# Patient Record
Sex: Male | Born: 1945 | Race: White | Hispanic: No | Marital: Married | Smoking: Never smoker
Health system: Southern US, Community
[De-identification: ages and names within clinical notes are randomized; demographics above are authoritative.]

## PROBLEM LIST (undated history)

## (undated) DIAGNOSIS — I2692 Saddle embolus of pulmonary artery without acute cor pulmonale: Secondary | ICD-10-CM

## (undated) DIAGNOSIS — E785 Hyperlipidemia, unspecified: Secondary | ICD-10-CM

## (undated) DIAGNOSIS — I10 Essential (primary) hypertension: Secondary | ICD-10-CM

## (undated) DIAGNOSIS — M8580 Other specified disorders of bone density and structure, unspecified site: Secondary | ICD-10-CM

## (undated) DIAGNOSIS — C61 Malignant neoplasm of prostate: Secondary | ICD-10-CM

## (undated) DIAGNOSIS — R972 Elevated prostate specific antigen [PSA]: Secondary | ICD-10-CM

## (undated) DIAGNOSIS — I2699 Other pulmonary embolism without acute cor pulmonale: Secondary | ICD-10-CM

## (undated) DIAGNOSIS — D126 Benign neoplasm of colon, unspecified: Secondary | ICD-10-CM

## (undated) HISTORY — DX: Hyperlipidemia, unspecified: E78.5

## (undated) HISTORY — DX: Essential (primary) hypertension: I10

## (undated) HISTORY — DX: Saddle embolus of pulmonary artery without acute cor pulmonale: I26.92

## (undated) HISTORY — DX: Benign neoplasm of colon, unspecified: D12.6

## (undated) HISTORY — DX: Malignant neoplasm of prostate: C61

## (undated) HISTORY — DX: Other specified disorders of bone density and structure, unspecified site: M85.80

---
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
## 1976-07-05 HISTORY — PX: VASECTOMY: SHX75

## 2013-01-12 DIAGNOSIS — C61 Malignant neoplasm of prostate: Secondary | ICD-10-CM | POA: Diagnosis not present

## 2013-04-14 DIAGNOSIS — Z23 Encounter for immunization: Secondary | ICD-10-CM | POA: Diagnosis not present

## 2013-06-19 DIAGNOSIS — H40019 Open angle with borderline findings, low risk, unspecified eye: Secondary | ICD-10-CM | POA: Diagnosis not present

## 2013-06-20 ENCOUNTER — Ambulatory Visit (AMBULATORY_SURGERY_CENTER): Payer: Self-pay | Admitting: *Deleted

## 2013-06-20 VITALS — Ht 70.0 in | Wt 191.2 lb

## 2013-06-20 DIAGNOSIS — Z1211 Encounter for screening for malignant neoplasm of colon: Secondary | ICD-10-CM

## 2013-06-20 MED ORDER — MOVIPREP 100 G PO SOLR: ORAL | Status: DC

## 2013-06-20 NOTE — Progress Notes (Signed)
No allergies to eggs or soy. No problems with anesthesia.  

## 2013-07-09 ENCOUNTER — Ambulatory Visit (AMBULATORY_SURGERY_CENTER): Payer: Medicare Other | Admitting: Internal Medicine

## 2013-07-09 ENCOUNTER — Encounter: Payer: Self-pay | Admitting: Internal Medicine

## 2013-07-09 VITALS — BP 99/59 | HR 50 | Temp 96.4°F | Resp 17 | Ht 70.0 in | Wt 191.0 lb

## 2013-07-09 DIAGNOSIS — Z1211 Encounter for screening for malignant neoplasm of colon: Secondary | ICD-10-CM

## 2013-07-09 DIAGNOSIS — I10 Essential (primary) hypertension: Secondary | ICD-10-CM | POA: Diagnosis not present

## 2013-07-09 DIAGNOSIS — D126 Benign neoplasm of colon, unspecified: Secondary | ICD-10-CM

## 2013-07-09 MED ORDER — SODIUM CHLORIDE 0.9 % IV SOLN: 500.0000 mL | INTRAVENOUS | Status: DC

## 2013-07-09 NOTE — Progress Notes (Signed)
Report to pacu, rn, vss, bbs=clear at [phone]

## 2013-07-09 NOTE — Patient Instructions (Signed)
YOU HAD AN ENDOSCOPIC PROCEDURE TODAY AT THE [facility]: Refer to the procedure report that was given to you for any specific questions about what was found during the examination.  If the procedure report does not answer your questions, please call your gastroenterologist to clarify.  If you requested that your care partner not be given the details of your procedure findings, then the procedure report has been included in a sealed envelope for you to review at your convenience later.  YOU SHOULD EXPECT: Some feelings of bloating in the abdomen. Passage of more gas than usual.  Walking can help get rid of the air that was put into your GI tract during the procedure and reduce the bloating. If you had a lower endoscopy (such as a colonoscopy or flexible sigmoidoscopy) you may notice spotting of blood in your stool or on the toilet paper. If you underwent a bowel prep for your procedure, then you may not have a normal bowel movement for a few days.  DIET: Your first meal following the procedure should be a light meal and then it is ok to progress to your normal diet.  A half-sandwich or bowl of soup is an example of a good first meal.  Heavy or fried foods are harder to digest and may make you feel nauseous or bloated.  Likewise meals heavy in dairy and vegetables can cause extra gas to form and this can also increase the bloating.  Drink plenty of fluids but you should avoid alcoholic beverages for 24 hours.  ACTIVITY: Your care partner should take you home directly after the procedure.  You should plan to take it easy, moving slowly for the rest of the day.  You can resume normal activity the day after the procedure however you should NOT DRIVE or use heavy machinery for 24 hours (because of the sedation medicines used during the test).    SYMPTOMS TO REPORT IMMEDIATELY: A gastroenterologist can be reached at any hour.  During normal business hours, 8:30 AM to 5:00 PM Monday through Friday,  call [phone].  After hours and on weekends, please call the GI answering service at [phone] who will take a message and have the physician on call contact you.   Following lower endoscopy (colonoscopy or flexible sigmoidoscopy):  Excessive amounts of blood in the stool  Significant tenderness or worsening of abdominal pains  Swelling of the abdomen that is new, acute  Fever of 100F or higher    FOLLOW UP: If any biopsies were taken you will be contacted by phone or by letter within the next 1-3 weeks.  Call your gastroenterologist if you have not heard about the biopsies in 3 weeks.  Our staff will call the home number listed on your records the next business day following your procedure to check on you and address any questions or concerns that you may have at that time regarding the information given to you following your procedure. This is a courtesy call and so if there is no answer at the home number and we have not heard from you through the emergency physician on call, we will assume that you have returned to your regular daily activities without incident.  SIGNATURES/CONFIDENTIALITY: You and/or your care partner have signed paperwork which will be entered into your electronic medical record.  These signatures attest to the fact that that the information above on your After Visit Summary has been reviewed and is understood.  Full responsibility of the confidentiality   of this discharge information lies with you and/or your care-partner.     

## 2013-07-09 NOTE — Progress Notes (Signed)
Called to room to assist during endoscopic procedure.  Patient ID and intended procedure confirmed with present staff. Received instructions for my participation in the procedure from the performing physician.  

## 2013-07-10 ENCOUNTER — Telehealth: Payer: Self-pay

## 2013-07-10 NOTE — Telephone Encounter (Signed)
  Follow up Call-  Call back number 07/09/2013  Post procedure Call Back phone  # [phone]  Permission to leave phone message Yes     Patient questions:  Do you have a fever, pain , or abdominal swelling? no Pain Score  0 *  Have you tolerated food without any problems? yes  Have you been able to return to your normal activities? yes  Do you have any questions about your discharge instructions: Diet   no Medications  no Follow up visit  no  Do you have questions or concerns about your Care? no  Actions: * If pain score is 4 or above: No action needed, pain <4.

## 2013-07-13 ENCOUNTER — Encounter: Payer: Self-pay | Admitting: Internal Medicine

## 2013-07-19 DIAGNOSIS — Z125 Encounter for screening for malignant neoplasm of prostate: Secondary | ICD-10-CM | POA: Diagnosis not present

## 2013-07-19 DIAGNOSIS — E785 Hyperlipidemia, unspecified: Secondary | ICD-10-CM | POA: Diagnosis not present

## 2013-07-19 DIAGNOSIS — R7301 Impaired fasting glucose: Secondary | ICD-10-CM | POA: Diagnosis not present

## 2013-07-19 DIAGNOSIS — I10 Essential (primary) hypertension: Secondary | ICD-10-CM | POA: Diagnosis not present

## 2013-07-25 DIAGNOSIS — I10 Essential (primary) hypertension: Secondary | ICD-10-CM | POA: Diagnosis not present

## 2013-07-25 DIAGNOSIS — E785 Hyperlipidemia, unspecified: Secondary | ICD-10-CM | POA: Diagnosis not present

## 2013-07-25 DIAGNOSIS — Z23 Encounter for immunization: Secondary | ICD-10-CM | POA: Diagnosis not present

## 2013-07-25 DIAGNOSIS — R7301 Impaired fasting glucose: Secondary | ICD-10-CM | POA: Diagnosis not present

## 2013-07-25 DIAGNOSIS — Z0000 Encounter for general adult medical examination without abnormal findings: Secondary | ICD-10-CM | POA: Diagnosis not present

## 2013-07-25 DIAGNOSIS — G4700 Insomnia, unspecified: Secondary | ICD-10-CM | POA: Diagnosis not present

## 2013-07-25 DIAGNOSIS — C61 Malignant neoplasm of prostate: Secondary | ICD-10-CM | POA: Diagnosis not present

## 2013-07-25 DIAGNOSIS — Z86010 Personal history of colonic polyps: Secondary | ICD-10-CM | POA: Diagnosis not present

## 2013-07-25 DIAGNOSIS — Z1331 Encounter for screening for depression: Secondary | ICD-10-CM | POA: Diagnosis not present

## 2013-07-25 DIAGNOSIS — M722 Plantar fascial fibromatosis: Secondary | ICD-10-CM | POA: Diagnosis not present

## 2013-07-27 NOTE — Op Note (Signed)
[place]  [address] [place], [address]   COLONOSCOPY PROCEDURE REPORT  PATIENT: Anthony Miles, Anthony Miles  MR#: [id number] BIRTHDATE: 08/24/1945 , 49  yrs. old GENDER: Male ENDOSCOPIST: Eustace Quail, MD REFERRED BY:W.  Lutricia Feil, M.D. PROCEDURE DATE:  07/09/2013 PROCEDURE:   Colonoscopy with snare polypectomy x4 First Screening Colonoscopy - Avg.  risk and is 50 yrs.  old or older - No.  Prior Negative Screening - Now for repeat screening. 10 or more years since last screening  History of Adenoma - Now for follow-up colonoscopy & has been > or = to 3 yrs.  N/A  Polyps Removed Today? Yes. ASA CLASS:   Class II INDICATIONS:average risk screening.   patient reports normal exam in [place] 11 years ago MEDICATIONS: MAC sedation, administered by CRNA and propofol (Diprivan) 350mg  IV  DESCRIPTION OF PROCEDURE:   After the risks benefits and alternatives of the procedure were thoroughly explained, informed consent was obtained.  A digital rectal exam revealed no abnormalities of the rectum.   The LB [id number]  endoscope was introduced through the anus and advanced to the cecum, which was identified by both the appendix and ileocecal valve. No adverse events experienced.   The quality of the prep was excellent, using MoviPrep  The instrument was then slowly withdrawn as the colon was fully examined.  COLON FINDINGS: Four polyps were found in the ascending colon(7 mm, 8 mm), transverse colon(5 mm), and rectum(10 mm).  A polypectomy was performed with a cold snarefor the smaller 3 and hot snare for the rectal lesion.  The resection was complete and the polyp tissue was completely retrieved.   The colon mucosa was otherwise normal. Retroflexed views revealed no abnormalities. The time to cecum=1 minutes 39 seconds.  Withdrawal time=14 minutes 21 seconds.  The scope was withdrawn and the procedure completed. COMPLICATIONS: There were no  complications.  ENDOSCOPIC IMPRESSION: 1.   Four polyps were found in the  colon, and rectum; polypectomy was performed with a cold snare 2.   The colon mucosa was otherwise normal  RECOMMENDATIONS: 1. Repeat Colonoscopy in 3 years(multiple polyps).   eSigned:  Eustace Quail, MD 07/27/2013 4:56 PM   cc: Janalyn Rouse, MD and The Patient

## 2013-08-03 DIAGNOSIS — C61 Malignant neoplasm of prostate: Secondary | ICD-10-CM | POA: Diagnosis not present

## 2013-08-03 DIAGNOSIS — N401 Enlarged prostate with lower urinary tract symptoms: Secondary | ICD-10-CM | POA: Diagnosis not present

## 2013-08-03 DIAGNOSIS — N138 Other obstructive and reflux uropathy: Secondary | ICD-10-CM | POA: Diagnosis not present

## 2013-08-03 DIAGNOSIS — N139 Obstructive and reflux uropathy, unspecified: Secondary | ICD-10-CM | POA: Diagnosis not present

## 2013-11-14 DIAGNOSIS — D075 Carcinoma in situ of prostate: Secondary | ICD-10-CM | POA: Diagnosis not present

## 2013-11-14 DIAGNOSIS — C61 Malignant neoplasm of prostate: Secondary | ICD-10-CM | POA: Diagnosis not present

## 2013-11-14 DIAGNOSIS — IMO0002 Reserved for concepts with insufficient information to code with codable children: Secondary | ICD-10-CM | POA: Diagnosis not present

## 2013-11-14 DIAGNOSIS — N400 Enlarged prostate without lower urinary tract symptoms: Secondary | ICD-10-CM | POA: Diagnosis not present

## 2013-11-27 DIAGNOSIS — L570 Actinic keratosis: Secondary | ICD-10-CM | POA: Diagnosis not present

## 2013-11-27 DIAGNOSIS — Z85828 Personal history of other malignant neoplasm of skin: Secondary | ICD-10-CM | POA: Diagnosis not present

## 2013-11-27 DIAGNOSIS — L821 Other seborrheic keratosis: Secondary | ICD-10-CM | POA: Diagnosis not present

## 2013-11-27 DIAGNOSIS — D1801 Hemangioma of skin and subcutaneous tissue: Secondary | ICD-10-CM | POA: Diagnosis not present

## 2014-05-13 DIAGNOSIS — Z23 Encounter for immunization: Secondary | ICD-10-CM | POA: Diagnosis not present

## 2014-05-22 DIAGNOSIS — C61 Malignant neoplasm of prostate: Secondary | ICD-10-CM | POA: Diagnosis not present

## 2014-06-20 ENCOUNTER — Other Ambulatory Visit: Payer: Self-pay | Admitting: Dermatology

## 2014-06-20 DIAGNOSIS — C44719 Basal cell carcinoma of skin of left lower limb, including hip: Secondary | ICD-10-CM | POA: Diagnosis not present

## 2014-06-20 DIAGNOSIS — C44729 Squamous cell carcinoma of skin of left lower limb, including hip: Secondary | ICD-10-CM | POA: Diagnosis not present

## 2014-06-20 DIAGNOSIS — D485 Neoplasm of uncertain behavior of skin: Secondary | ICD-10-CM | POA: Diagnosis not present

## 2014-06-20 DIAGNOSIS — Z85828 Personal history of other malignant neoplasm of skin: Secondary | ICD-10-CM | POA: Diagnosis not present

## 2014-06-25 DIAGNOSIS — H40013 Open angle with borderline findings, low risk, bilateral: Secondary | ICD-10-CM | POA: Diagnosis not present

## 2014-07-05 DIAGNOSIS — C61 Malignant neoplasm of prostate: Secondary | ICD-10-CM

## 2014-07-05 HISTORY — DX: Malignant neoplasm of prostate: C61

## 2014-07-22 DIAGNOSIS — Z0000 Encounter for general adult medical examination without abnormal findings: Secondary | ICD-10-CM | POA: Diagnosis not present

## 2014-07-22 DIAGNOSIS — E785 Hyperlipidemia, unspecified: Secondary | ICD-10-CM | POA: Diagnosis not present

## 2014-07-22 DIAGNOSIS — I10 Essential (primary) hypertension: Secondary | ICD-10-CM | POA: Diagnosis not present

## 2014-07-22 DIAGNOSIS — R7301 Impaired fasting glucose: Secondary | ICD-10-CM | POA: Diagnosis not present

## 2014-07-22 DIAGNOSIS — Z125 Encounter for screening for malignant neoplasm of prostate: Secondary | ICD-10-CM | POA: Diagnosis not present

## 2014-08-07 DIAGNOSIS — Z1212 Encounter for screening for malignant neoplasm of rectum: Secondary | ICD-10-CM | POA: Diagnosis not present

## 2014-08-14 DIAGNOSIS — M722 Plantar fascial fibromatosis: Secondary | ICD-10-CM | POA: Diagnosis not present

## 2014-08-14 DIAGNOSIS — Z6828 Body mass index (BMI) 28.0-28.9, adult: Secondary | ICD-10-CM | POA: Diagnosis not present

## 2014-08-14 DIAGNOSIS — R7301 Impaired fasting glucose: Secondary | ICD-10-CM | POA: Diagnosis not present

## 2014-08-14 DIAGNOSIS — E785 Hyperlipidemia, unspecified: Secondary | ICD-10-CM | POA: Diagnosis not present

## 2014-08-14 DIAGNOSIS — I10 Essential (primary) hypertension: Secondary | ICD-10-CM | POA: Diagnosis not present

## 2014-08-14 DIAGNOSIS — Z86010 Personal history of colonic polyps: Secondary | ICD-10-CM | POA: Diagnosis not present

## 2014-08-14 DIAGNOSIS — Z23 Encounter for immunization: Secondary | ICD-10-CM | POA: Diagnosis not present

## 2014-08-14 DIAGNOSIS — C61 Malignant neoplasm of prostate: Secondary | ICD-10-CM | POA: Diagnosis not present

## 2014-08-14 DIAGNOSIS — Z1389 Encounter for screening for other disorder: Secondary | ICD-10-CM | POA: Diagnosis not present

## 2014-08-14 DIAGNOSIS — Z0000 Encounter for general adult medical examination without abnormal findings: Secondary | ICD-10-CM | POA: Diagnosis not present

## 2014-08-14 DIAGNOSIS — G4700 Insomnia, unspecified: Secondary | ICD-10-CM | POA: Diagnosis not present

## 2014-09-18 DIAGNOSIS — H52223 Regular astigmatism, bilateral: Secondary | ICD-10-CM | POA: Diagnosis not present

## 2014-09-18 DIAGNOSIS — H5213 Myopia, bilateral: Secondary | ICD-10-CM | POA: Diagnosis not present

## 2014-09-18 DIAGNOSIS — T1501XA Foreign body in cornea, right eye, initial encounter: Secondary | ICD-10-CM | POA: Diagnosis not present

## 2014-11-29 DIAGNOSIS — N401 Enlarged prostate with lower urinary tract symptoms: Secondary | ICD-10-CM | POA: Diagnosis not present

## 2014-11-29 DIAGNOSIS — R3916 Straining to void: Secondary | ICD-10-CM | POA: Diagnosis not present

## 2014-11-29 DIAGNOSIS — C61 Malignant neoplasm of prostate: Secondary | ICD-10-CM | POA: Diagnosis not present

## 2014-12-09 ENCOUNTER — Other Ambulatory Visit (HOSPITAL_COMMUNITY): Payer: Self-pay | Admitting: Urology

## 2014-12-09 DIAGNOSIS — R972 Elevated prostate specific antigen [PSA]: Secondary | ICD-10-CM

## 2014-12-09 DIAGNOSIS — C61 Malignant neoplasm of prostate: Secondary | ICD-10-CM

## 2014-12-25 ENCOUNTER — Ambulatory Visit (HOSPITAL_COMMUNITY)
Admission: RE | Admit: 2014-12-25 | Discharge: 2014-12-25 | Disposition: A | Discharge status: Home / Self Care | Payer: Medicare Other | Source: Ambulatory Visit | Attending: Urology | Admitting: Urology

## 2014-12-25 DIAGNOSIS — C61 Malignant neoplasm of prostate: Secondary | ICD-10-CM | POA: Diagnosis not present

## 2014-12-25 DIAGNOSIS — R972 Elevated prostate specific antigen [PSA]: Secondary | ICD-10-CM | POA: Insufficient documentation

## 2014-12-25 LAB — POCT I-STAT CREATININE: Creatinine, Ser: 1.3 mg/dL — ABNORMAL HIGH (ref 0.61–1.24)

## 2014-12-25 MED ORDER — GADOBENATE DIMEGLUMINE 529 MG/ML IV SOLN
20.0000 mL | Freq: Once | INTRAVENOUS | Status: AC | PRN
  Administered 2014-12-25: 17 mL via INTRAVENOUS

## 2014-12-26 DIAGNOSIS — C61 Malignant neoplasm of prostate: Secondary | ICD-10-CM | POA: Diagnosis not present

## 2015-01-02 ENCOUNTER — Telehealth: Payer: Self-pay | Admitting: Medical Oncology

## 2015-01-02 NOTE — Telephone Encounter (Signed)
Oncology Nurse Navigator Documentation  Oncology Nurse Navigator Flowsheets 01/02/2015  Referral date to RadOnc/MedOnc 01/01/2015  Navigator Encounter Type Introductory phone call- I called pt to introduce myself as the Prostate oncology navigator. I spoke with his wife and she states after leaving Dr. Lynne Logan office and reading literature about the robotic prostatectomy, he has decided to do surgery. He called Nira Conn, Dr.Borden's nurse late yesterday and informed her. They have an appointment in his office July 12. She would like to cancel the appointment July 8th. I will verify this with Dr. Alinda Money.   Time Spent with Patient 15

## 2015-01-10 ENCOUNTER — Ambulatory Visit: Payer: Medicare Other | Admitting: Radiation Oncology

## 2015-01-14 DIAGNOSIS — C61 Malignant neoplasm of prostate: Secondary | ICD-10-CM | POA: Diagnosis not present

## 2015-01-16 ENCOUNTER — Other Ambulatory Visit: Payer: Self-pay | Admitting: Urology

## 2015-02-04 DIAGNOSIS — J019 Acute sinusitis, unspecified: Secondary | ICD-10-CM | POA: Diagnosis not present

## 2015-03-12 DIAGNOSIS — C61 Malignant neoplasm of prostate: Secondary | ICD-10-CM | POA: Diagnosis not present

## 2015-03-12 DIAGNOSIS — M6281 Muscle weakness (generalized): Secondary | ICD-10-CM | POA: Diagnosis not present

## 2015-03-12 DIAGNOSIS — R278 Other lack of coordination: Secondary | ICD-10-CM | POA: Diagnosis not present

## 2015-03-18 DIAGNOSIS — N401 Enlarged prostate with lower urinary tract symptoms: Secondary | ICD-10-CM | POA: Diagnosis not present

## 2015-03-18 DIAGNOSIS — R3916 Straining to void: Secondary | ICD-10-CM | POA: Diagnosis not present

## 2015-03-18 DIAGNOSIS — R278 Other lack of coordination: Secondary | ICD-10-CM | POA: Diagnosis not present

## 2015-03-18 DIAGNOSIS — M6281 Muscle weakness (generalized): Secondary | ICD-10-CM | POA: Diagnosis not present

## 2015-04-03 NOTE — Patient Instructions (Addendum)
Anthony Miles  04/03/2015   Your procedure is scheduled on: Thursday 04/10/2015  Report to [facility]  Entrance take [place]  elevators to 3rd floor to  [place] at  [place] AM.  Call this number if you have problems the morning of surgery [phone]   Remember: ONLY 1 PERSON MAY GO WITH YOU TO SHORT STAY TO GET  READY MORNING OF [place].  Do not eat food or drink liquids :After Midnight.              FOLLOW BOWEL PREP INSTRUCTIONS FROM DR. BORDEN'S OFFICE AND FOLLOW A CLEAR LIQUID DIET THE DAY BEFORE!   CLEAR LIQUID DIET   Foods Allowed                                                                     Foods Excluded  Coffee and tea, regular and decaf                             liquids that you cannot  Plain Jell-O in any flavor                                             see through such as: Fruit ices (not with fruit pulp)                                     milk, soups, orange juice  Iced Popsicles                                    All solid food Carbonated beverages, regular and diet                                    Cranberry, grape and apple juices Sports drinks like Gatorade Lightly seasoned clear broth or consume(fat free) Sugar, honey syrup  Sample Menu Breakfast                                Lunch                                     Supper Cranberry juice                    Beef broth                            Chicken broth Jell-O  Grape juice                           Apple juice Coffee or tea                        Jell-O                                      Popsicle                                                Coffee or tea                        Coffee or tea  _____________________________________________________________________              Take these medicines the morning of surgery with A SIP OF WATER: none  DO NOT TAKE ANY DIABETIC MEDICATIONS DAY OF YOUR SURGERY                         You may not have any metal on your body including hair pins and              piercings  Do not wear jewelry, make-up, lotions, powders or perfumes, deodorant             Do not wear nail polish.  Do not shave  48 hours prior to surgery.              Men may shave face and neck.   Do not bring valuables to the hospital. [place].  Contacts, dentures or bridgework may not be worn into surgery.  Leave suitcase in the car. After surgery it may be brought to your room.     Patients discharged the day of surgery will not be allowed to drive home.  Name and phone number of your driver:  Special Instructions: N/A              Please read over the following fact sheets you were given: _____________________________________________________________________             [facility] - Preparing for Surgery Before surgery, you can play an important role.  Because skin is not sterile, your skin needs to be as free of germs as possible.  You can reduce the number of germs on your skin by washing with CHG (chlorahexidine gluconate) soap before surgery.  CHG is an antiseptic cleaner which kills germs and bonds with the skin to continue killing germs even after washing. Please DO NOT use if you have an allergy to CHG or antibacterial soaps.  If your skin becomes reddened/irritated stop using the CHG and inform your nurse when you arrive at Short Stay. Do not shave (including legs and underarms) for at least 48 hours prior to the first CHG shower.  You may shave your face/neck. Please follow these instructions carefully:  1.  Shower with CHG Soap the night before surgery and the  morning of Surgery.  2.  If you choose to wash your  hair, wash your hair first as usual with your  normal  shampoo.  3.  After you shampoo, rinse your hair and body thoroughly to remove the  shampoo.                           4.  Use CHG as you would any other  liquid soap.  You can apply chg directly  to the skin and wash                       Gently with a scrungie or clean washcloth.  5.  Apply the CHG Soap to your body ONLY FROM THE NECK DOWN.   Do not use on face/ open                           Wound or open sores. Avoid contact with eyes, ears mouth and genitals (private parts).                       Wash face,  Genitals (private parts) with your normal soap.             6.  Wash thoroughly, paying special attention to the area where your surgery  will be performed.  7.  Thoroughly rinse your body with warm water from the neck down.  8.  DO NOT shower/wash with your normal soap after using and rinsing off  the CHG Soap.                9.  Pat yourself dry with a clean towel.            10.  Wear clean pajamas.            11.  Place clean sheets on your bed the night of your first shower and do not  sleep with pets. Day of Surgery : Do not apply any lotions/deodorants the morning of surgery.  Please wear clean clothes to the hospital/surgery center.  FAILURE TO FOLLOW THESE INSTRUCTIONS MAY RESULT IN THE CANCELLATION OF YOUR SURGERY PATIENT SIGNATURE_________________________________  NURSE SIGNATURE__________________________________  ________________________________________________________________________   Anthony Miles  An incentive spirometer is a tool that can help keep your lungs clear and active. This tool measures how well you are filling your lungs with each breath. Taking long deep breaths may help reverse or decrease the chance of developing breathing (pulmonary) problems (especially infection) following:  A long period of time when you are unable to move or be active. BEFORE THE PROCEDURE   If the spirometer includes an indicator to show your best effort, your nurse or respiratory therapist will set it to a desired goal.  If possible, sit up straight or lean slightly forward. Try not to slouch.  Hold the incentive  spirometer in an upright position. INSTRUCTIONS FOR USE   Sit on the edge of your bed if possible, or sit up as far as you can in bed or on a chair.  Hold the incentive spirometer in an upright position.  Breathe out normally.  Place the mouthpiece in your mouth and seal your lips tightly around it.  Breathe in slowly and as deeply as possible, raising the piston or the ball toward the top of the column.  Hold your breath for 3-5 seconds or for as long as possible. Allow the piston or ball to fall to the bottom of  the column.  Remove the mouthpiece from your mouth and breathe out normally.  Rest for a few seconds and repeat Steps 1 through 7 at least 10 times every 1-2 hours when you are awake. Take your time and take a few normal breaths between deep breaths.  The spirometer may include an indicator to show your best effort. Use the indicator as a goal to work toward during each repetition.  After each set of 10 deep breaths, practice coughing to be sure your lungs are clear. If you have an incision (the cut made at the time of surgery), support your incision when coughing by placing a pillow or rolled up towels firmly against it. Once you are able to get out of bed, walk around indoors and cough well. You may stop using the incentive spirometer when instructed by your caregiver.  RISKS AND COMPLICATIONS  Take your time so you do not get dizzy or light-headed.  If you are in pain, you may need to take or ask for pain medication before doing incentive spirometry. It is harder to take a deep breath if you are having pain. AFTER USE  Rest and breathe slowly and easily.  It can be helpful to keep track of a log of your progress. Your caregiver can provide you with a simple table to help with this. If you are using the spirometer at home, follow these instructions: [place] IF:   You are having difficultly using the spirometer.  You have trouble using the spirometer as  often as instructed.  Your pain medication is not giving enough relief while using the spirometer.  You develop fever of 100.5 F (38.1 C) or higher. SEEK IMMEDIATE MEDICAL CARE IF:   You cough up bloody sputum that had not been present before.  You develop fever of 102 F (38.9 C) or greater.  You develop worsening pain at or near the incision site. MAKE SURE YOU:   Understand these instructions.  Will watch your condition.  Will get help right away if you are not doing well or get worse. Document Released: 11/01/2006 Document Revised: 09/13/2011 Document Reviewed: 01/02/2007 ExitCare Patient Information 2014 ExitCare, [place].   ________________________________________________________________________ WHAT IS A BLOOD TRANSFUSION? Blood Transfusion Information  A transfusion is the replacement of blood or some of its parts. Blood is made up of multiple cells which provide different functions.  Red blood cells carry oxygen and are used for blood loss replacement.  White blood cells fight against infection.  Platelets control bleeding.  Plasma helps clot blood.  Other blood products are available for specialized needs, such as hemophilia or other clotting disorders. BEFORE THE TRANSFUSION  Who gives blood for transfusions?   Healthy volunteers who are fully evaluated to make sure their blood is safe. This is blood bank blood. Transfusion therapy is the safest it has ever been in the practice of medicine. Before blood is taken from a donor, a complete history is taken to make sure that person has no history of diseases nor engages in risky social behavior (examples are intravenous drug use or sexual activity with multiple partners). The donor's travel history is screened to minimize risk of transmitting infections, such as malaria. The donated blood is tested for signs of infectious diseases, such as HIV and hepatitis. The blood is then tested to be sure it is compatible with  you in order to minimize the chance of a transfusion reaction. If you or a relative donates blood, this is often done  in anticipation of surgery and is not appropriate for emergency situations. It takes many days to process the donated blood. RISKS AND COMPLICATIONS Although transfusion therapy is very safe and saves many lives, the main dangers of transfusion include:   Getting an infectious disease.  Developing a transfusion reaction. This is an allergic reaction to something in the blood you were given. Every precaution is taken to prevent this. The decision to have a blood transfusion has been considered carefully by your caregiver before blood is given. Blood is not given unless the benefits outweigh the risks. AFTER THE TRANSFUSION  Right after receiving a blood transfusion, you will usually feel much better and more energetic. This is especially true if your red blood cells have gotten low (anemic). The transfusion raises the level of the red blood cells which carry oxygen, and this usually causes an energy increase.  The nurse administering the transfusion will monitor you carefully for complications. HOME CARE INSTRUCTIONS  No special instructions are needed after a transfusion. You may find your energy is better. Speak with your caregiver about any limitations on activity for underlying diseases you may have. SEEK MEDICAL CARE IF:   Your condition is not improving after your transfusion.  You develop redness or irritation at the intravenous (IV) site. SEEK IMMEDIATE MEDICAL CARE IF:  Any of the following symptoms occur over the next 12 hours:  Shaking chills.  You have a temperature by mouth above 102 F (38.9 C), not controlled by medicine.  Chest, back, or muscle pain.  People around you feel you are not acting correctly or are confused.  Shortness of breath or difficulty breathing.  Dizziness and fainting.  You get a rash or develop hives.  You have a decrease in  urine output.  Your urine turns a dark color or changes to pink, red, or brown. Any of the following symptoms occur over the next 10 days:  You have a temperature by mouth above 102 F (38.9 C), not controlled by medicine.  Shortness of breath.  Weakness after normal activity.  The white part of the eye turns yellow (jaundice).  You have a decrease in the amount of urine or are urinating less often.  Your urine turns a dark color or changes to pink, red, or brown. Document Released: 06/18/2000 Document Revised: 09/13/2011 Document Reviewed: 02/05/2008 [facility] Patient Information 2014 [place], [place].  _______________________________________________________________________

## 2015-04-07 ENCOUNTER — Encounter (HOSPITAL_COMMUNITY)
Admission: RE | Admit: 2015-04-07 | Discharge: 2015-04-07 | Disposition: A | Discharge status: Home / Self Care | Payer: Medicare Other | Source: Ambulatory Visit | Attending: Urology | Admitting: Urology

## 2015-04-07 ENCOUNTER — Encounter (HOSPITAL_COMMUNITY): Payer: Self-pay

## 2015-04-07 ENCOUNTER — Ambulatory Visit (HOSPITAL_COMMUNITY)
Admission: RE | Admit: 2015-04-07 | Discharge: 2015-04-07 | Disposition: A | Discharge status: Home / Self Care | Payer: Medicare Other | Source: Ambulatory Visit | Attending: Urology | Admitting: Urology

## 2015-04-07 DIAGNOSIS — I10 Essential (primary) hypertension: Secondary | ICD-10-CM | POA: Diagnosis not present

## 2015-04-07 DIAGNOSIS — R001 Bradycardia, unspecified: Secondary | ICD-10-CM

## 2015-04-07 DIAGNOSIS — Z01812 Encounter for preprocedural laboratory examination: Secondary | ICD-10-CM | POA: Insufficient documentation

## 2015-04-07 DIAGNOSIS — E7800 Pure hypercholesterolemia, unspecified: Secondary | ICD-10-CM | POA: Diagnosis not present

## 2015-04-07 DIAGNOSIS — C61 Malignant neoplasm of prostate: Secondary | ICD-10-CM | POA: Insufficient documentation

## 2015-04-07 DIAGNOSIS — Z0183 Encounter for blood typing: Secondary | ICD-10-CM | POA: Insufficient documentation

## 2015-04-07 DIAGNOSIS — Z01818 Encounter for other preprocedural examination: Secondary | ICD-10-CM

## 2015-04-07 DIAGNOSIS — Z7982 Long term (current) use of aspirin: Secondary | ICD-10-CM | POA: Diagnosis not present

## 2015-04-07 LAB — CBC
HCT: 47.1 % (ref 39.0–52.0)
HEMOGLOBIN: 15.7 g/dL (ref 13.0–17.0)
MCH: 31.7 pg (ref 26.0–34.0)
MCHC: 33.3 g/dL (ref 30.0–36.0)
MCV: 95 fL (ref 78.0–100.0)
Platelets: 247 10*3/uL (ref 150–400)
RBC: 4.96 MIL/uL (ref 4.22–5.81)
RDW: 13.2 % (ref 11.5–15.5)
WBC: 7.1 10*3/uL (ref 4.0–10.5)

## 2015-04-07 LAB — BASIC METABOLIC PANEL
ANION GAP: 8 (ref 5–15)
BUN: 22 mg/dL — ABNORMAL HIGH (ref 6–20)
CALCIUM: 9.2 mg/dL (ref 8.9–10.3)
CO2: 25 mmol/L (ref 22–32)
Chloride: 105 mmol/L (ref 101–111)
Creatinine, Ser: 1.27 mg/dL — ABNORMAL HIGH (ref 0.61–1.24)
GFR, EST NON AFRICAN AMERICAN: 57 mL/min — AB (ref >60)
GLUCOSE: 101 mg/dL — AB (ref 65–99)
POTASSIUM: 4.3 mmol/L (ref 3.5–5.1)
SODIUM: 138 mmol/L (ref 135–145)

## 2015-04-07 LAB — ABO/RH: ABO/RH(D): A POS

## 2015-04-07 NOTE — Progress Notes (Signed)
014/21/2015-EKG from [facility] on chart.

## 2015-04-09 NOTE — H&P (Signed)
History of Present Illness Mr. Anthony Miles is a 69 year old previously followed by Dr. Terance Hart with the following urologic history:    1) Prostate cancer: He was diagnosed with low risk clinically localized prostate cancer by Dr. Terance Hart in August 2010. After discussing management options, he elected to proceed with active surveillance.    Initial diagnosis: August 2010  TNM stage: cT2a (left induration) Nx Mx  PSA at diagnosis: 6.1  Gleason score: 3+3=6  Initial biopsy (02/06/09): 1/12 cores -- L mid (5%)  Prostate volume: 44 cc  PSAD: 0.14    Surveillance:  Nov 2011: 12 core biopsy -- No malignancy, L lateral base (atypia), L base (HGPIN), Vol 52 cc  Nov 2013: 12 core biopsy - 2/12 cores, L lateral mid (30%, 3+3=6), L mid (40%, 3+3=6), HGPIN at L base and L lateral base, Vol 61.5 cc  May 2015: 12 core biopsy- 2/12 cores - L lateral mid (30%, 3+3=6), L m id (50%, 3+3=6), Atyp at L lateral base and HGPIN at L apex, Vol 59.0 cc  May 2016 - increase of PSA to 11.26 Dec 2014: MRI of prostate - Left sided lesion with high suspicion for high grade, high volume disease, no LAD, SVI, or EPE  12/26/14: 16 core biopsy (10 from left side): 10/16 cores positive (all cores on left side positive), Vol 71.7 cc    Left: L lateral apex (2 cores, 50% and 80%, 3+4=7), L apex (2 cores, 40% and 30%, 3+4=7, PNI), L lateral mid (2 cores, 70% and 70%, 3+4=7), L mid (2 cores, 80% and 50%, 3+4=7), L lateral base (30%, 3+4=7), L base (30%, 3+4=7)     Past Medical History Problems  1. History of hypercholesterolemia (Z86.39) 2. History of hypertension (Z86.79) 3. Prostate cancer (C61)  Surgical History Problems  1. History of Surgery Of Male Genitalia Vasectomy 2. History of Tonsillectomy  Current Meds 1. Aspirin 81 MG TABS;  Therapy: (Recorded:23Jun2010) to Recorded 2. Crestor 20 MG Oral Tablet;  Therapy: [id number] to Recorded 3. Hydrochlorothiazide 25 MG Oral Tablet;  Therapy:  (HUTMLYYT:[id number]) to Recorded 4. Lisinopril 20 MG Oral Tablet;  Therapy: [id number] to Recorded 5. Lumigan 0.01 % Ophthalmic Solution;  Therapy: [id number] to Recorded 6. Zolpidem Tartrate 5 MG Oral Tablet;  Therapy: [id number] to Recorded  Allergies Medication  1. No Known Drug Allergies  Family History Problems  1. Family history of Breast Cancer : Mother 2. Family history of Death In The Family Father   age 35 of dementia 3. Family history of Death In The Family Mother   age 17 of breast cancer 37. Family history of Dementia : Father 41. Family history of Family Health Status Number Of Children   1 daughter  Social History Problems  1. Alcohol Use   2 a day 2. Marital History - Currently Married 3. Never A Smoker 4. Occupation:   retired Human resources officer  Physical Exam Constitutional: Well nourished and well developed . No acute distress.  ENT:. The ears and nose are normal in appearance.  Neck: The appearance of the neck is normal and no neck mass is present.  Pulmonary: No respiratory distress, normal respiratory rhythm and effort and clear bilateral breath sounds.  Cardiovascular: Heart rate and rhythm are normal . No peripheral edema.  Abdomen: The abdomen is soft and nontender. No masses are palpated. No CVA tenderness. No hernias are palpable. No hepatosplenomegaly noted.  Skin: Normal skin turgor, no visible rash and no visible skin lesions.  Neuro/Psych:. Mood and affect  are appropriate.    Results/Data Selected Results  PSA 27NTZ0017 08:35AM Raynelle Bring  SPECIMEN TYPE: BLOOD   Test Name Result Flag Reference  PSA 11.23 ng/mL H <=4.00  TEST METHODOLOGY: ECLIA PSA (ELECTROCHEMILUMINESCENCE IMMUNOASSAY)   Assessment Assessed  1. Prostate cancer (C61)    Discussion/Summary 1. Prostate cancer: He has decided to proceed with surgical therapy and will undergo a partial left nerve sparing and full right nerve sparing robot-assisted laparoscopic  radical prostatectomy and bilateral pelvic lymphadenectomy.

## 2015-04-09 NOTE — Anesthesia Preprocedure Evaluation (Addendum)
Anesthesia Evaluation  Patient identified by MRN, date of birth, ID band Patient awake    Reviewed: Allergy & Precautions, H&P , NPO status , Patient's Chart, lab work & pertinent test results  Airway Mallampati: II  TM Distance: >3 FB Neck ROM: full    Dental no notable dental hx. (+) Dental Advisory Given, Teeth Intact   Pulmonary neg pulmonary ROS,    Pulmonary exam normal breath sounds clear to auscultation       Cardiovascular Exercise Tolerance: Good hypertension, Pt. on medications negative cardio ROS Normal cardiovascular exam Rhythm:regular Rate:Normal     Neuro/Psych negative neurological ROS  negative psych ROS   GI/Hepatic negative GI ROS, Neg liver ROS,   Endo/Other  negative endocrine ROS  Renal/GU negative Renal ROS  negative genitourinary   Musculoskeletal   Abdominal   Peds  Hematology negative hematology ROS (+)   Anesthesia Other Findings   Reproductive/Obstetrics negative OB ROS                            Anesthesia Physical Anesthesia Plan  ASA: II  Anesthesia Plan: General   Post-op Pain Management:    Induction: Intravenous  Airway Management Planned: Oral ETT  Additional Equipment:   Intra-op Plan:   Post-operative Plan: Extubation in OR  Informed Consent: I have reviewed the patients History and Physical, chart, labs and discussed the procedure including the risks, benefits and alternatives for the proposed anesthesia with the patient or authorized representative who has indicated his/her understanding and acceptance.   Dental Advisory Given  Plan Discussed with: CRNA and Surgeon  Anesthesia Plan Comments:         Anesthesia Quick Evaluation

## 2015-04-10 ENCOUNTER — Encounter (HOSPITAL_COMMUNITY): Payer: Self-pay | Admitting: *Deleted

## 2015-04-10 ENCOUNTER — Encounter (HOSPITAL_COMMUNITY)
Admission: RE | Disposition: A | Discharge status: Home / Self Care | Payer: Self-pay | Source: Ambulatory Visit | Attending: Urology

## 2015-04-10 ENCOUNTER — Inpatient Hospital Stay (HOSPITAL_COMMUNITY)
Admission: RE | Admit: 2015-04-10 | Discharge: 2015-04-11 | DRG: 708 | Disposition: A | Discharge status: Home / Self Care | Payer: Medicare Other | Source: Ambulatory Visit | Attending: Urology | Admitting: Urology

## 2015-04-10 ENCOUNTER — Inpatient Hospital Stay (HOSPITAL_COMMUNITY): Payer: Medicare Other | Admitting: Anesthesiology

## 2015-04-10 DIAGNOSIS — E7800 Pure hypercholesterolemia, unspecified: Secondary | ICD-10-CM | POA: Diagnosis present

## 2015-04-10 DIAGNOSIS — Z803 Family history of malignant neoplasm of breast: Secondary | ICD-10-CM | POA: Diagnosis not present

## 2015-04-10 DIAGNOSIS — Z7982 Long term (current) use of aspirin: Secondary | ICD-10-CM | POA: Diagnosis not present

## 2015-04-10 DIAGNOSIS — Z79899 Other long term (current) drug therapy: Secondary | ICD-10-CM

## 2015-04-10 DIAGNOSIS — I10 Essential (primary) hypertension: Secondary | ICD-10-CM | POA: Diagnosis not present

## 2015-04-10 DIAGNOSIS — C61 Malignant neoplasm of prostate: Secondary | ICD-10-CM | POA: Diagnosis not present

## 2015-04-10 HISTORY — PX: ROBOT ASSISTED LAPAROSCOPIC RADICAL PROSTATECTOMY: SHX5141

## 2015-04-10 HISTORY — PX: LYMPHADENECTOMY: SHX5960

## 2015-04-10 LAB — HEMOGLOBIN AND HEMATOCRIT, BLOOD
HEMATOCRIT: 38.4 % — AB (ref 39.0–52.0)
Hemoglobin: 12.5 g/dL — ABNORMAL LOW (ref 13.0–17.0)

## 2015-04-10 LAB — TYPE AND SCREEN
ABO/RH(D): A POS
ANTIBODY SCREEN: NEGATIVE

## 2015-04-10 SURGERY — ROBOTIC ASSISTED LAPAROSCOPIC RADICAL PROSTATECTOMY LEVEL 2
Anesthesia: General

## 2015-04-10 MED ORDER — FENTANYL CITRATE (PF) 100 MCG/2ML IJ SOLN
INTRAMUSCULAR | Status: AC
  Filled 2015-04-10: qty 4

## 2015-04-10 MED ORDER — MORPHINE SULFATE (PF) 2 MG/ML IV SOLN
2.0000 mg | INTRAVENOUS | Status: DC | PRN
  Administered 2015-04-10 (×2): 2 mg via INTRAVENOUS
  Filled 2015-04-10: qty 1
  Filled 2015-04-10: qty 2

## 2015-04-10 MED ORDER — FENTANYL CITRATE (PF) 250 MCG/5ML IJ SOLN
INTRAMUSCULAR | Status: AC
  Filled 2015-04-10: qty 25

## 2015-04-10 MED ORDER — LACTATED RINGERS IV SOLN: INTRAVENOUS | Status: DC

## 2015-04-10 MED ORDER — KCL IN DEXTROSE-NACL 20-5-0.45 MEQ/L-%-% IV SOLN
INTRAVENOUS | Status: DC
  Administered 2015-04-10 – 2015-04-11 (×3): via INTRAVENOUS
  Filled 2015-04-10 (×5): qty 1000

## 2015-04-10 MED ORDER — LACTATED RINGERS IV SOLN
INTRAVENOUS | Status: DC | PRN
  Administered 2015-04-10 (×2): via INTRAVENOUS

## 2015-04-10 MED ORDER — PROPOFOL 10 MG/ML IV BOLUS
INTRAVENOUS | Status: DC | PRN
  Administered 2015-04-10: 200 mg via INTRAVENOUS

## 2015-04-10 MED ORDER — FENTANYL CITRATE (PF) 100 MCG/2ML IJ SOLN
INTRAMUSCULAR | Status: DC | PRN
  Administered 2015-04-10 (×7): 50 ug via INTRAVENOUS

## 2015-04-10 MED ORDER — PROPOFOL 10 MG/ML IV BOLUS
INTRAVENOUS | Status: AC
  Filled 2015-04-10: qty 20

## 2015-04-10 MED ORDER — HEPARIN SODIUM (PORCINE) 1000 UNIT/ML IJ SOLN
INTRAMUSCULAR | Status: AC
  Filled 2015-04-10: qty 1

## 2015-04-10 MED ORDER — STERILE WATER FOR IRRIGATION IR SOLN
Status: DC | PRN
  Administered 2015-04-10: 1000 mL

## 2015-04-10 MED ORDER — DIPHENHYDRAMINE HCL 12.5 MG/5ML PO ELIX: 12.5000 mg | ORAL_SOLUTION | Freq: Four times a day (QID) | ORAL | Status: DC | PRN

## 2015-04-10 MED ORDER — ROSUVASTATIN CALCIUM 5 MG PO TABS
5.0000 mg | ORAL_TABLET | Freq: Every day | ORAL | Status: DC
  Administered 2015-04-10: 5 mg via ORAL
  Filled 2015-04-10: qty 1

## 2015-04-10 MED ORDER — BUPIVACAINE-EPINEPHRINE (PF) 0.25% -1:200000 IJ SOLN
INTRAMUSCULAR | Status: AC
  Filled 2015-04-10: qty 30

## 2015-04-10 MED ORDER — KETOROLAC TROMETHAMINE 15 MG/ML IJ SOLN
15.0000 mg | Freq: Four times a day (QID) | INTRAMUSCULAR | Status: DC
  Administered 2015-04-10 – 2015-04-11 (×5): 15 mg via INTRAVENOUS
  Filled 2015-04-10 (×5): qty 1

## 2015-04-10 MED ORDER — SULFAMETHOXAZOLE-TRIMETHOPRIM 800-160 MG PO TABS: 1.0000 | ORAL_TABLET | Freq: Two times a day (BID) | ORAL | Status: DC

## 2015-04-10 MED ORDER — DIPHENHYDRAMINE HCL 50 MG/ML IJ SOLN: 12.5000 mg | Freq: Four times a day (QID) | INTRAMUSCULAR | Status: DC | PRN

## 2015-04-10 MED ORDER — HYDROCODONE-ACETAMINOPHEN 5-325 MG PO TABS: 1.0000 | ORAL_TABLET | Freq: Four times a day (QID) | ORAL | Status: DC | PRN

## 2015-04-10 MED ORDER — MIDAZOLAM HCL 2 MG/2ML IJ SOLN
INTRAMUSCULAR | Status: AC
  Filled 2015-04-10: qty 4

## 2015-04-10 MED ORDER — KETOROLAC TROMETHAMINE 15 MG/ML IJ SOLN
INTRAMUSCULAR | Status: AC
  Filled 2015-04-10: qty 1

## 2015-04-10 MED ORDER — DOCUSATE SODIUM 100 MG PO CAPS
100.0000 mg | ORAL_CAPSULE | Freq: Two times a day (BID) | ORAL | Status: DC
  Administered 2015-04-10 – 2015-04-11 (×2): 100 mg via ORAL
  Filled 2015-04-10 (×2): qty 1

## 2015-04-10 MED ORDER — HYDROMORPHONE HCL 1 MG/ML IJ SOLN
INTRAMUSCULAR | Status: AC
  Filled 2015-04-10: qty 1

## 2015-04-10 MED ORDER — DEXAMETHASONE SODIUM PHOSPHATE 10 MG/ML IJ SOLN
INTRAMUSCULAR | Status: DC | PRN
  Administered 2015-04-10: 10 mg via INTRAVENOUS

## 2015-04-10 MED ORDER — LIDOCAINE HCL (CARDIAC) 20 MG/ML IV SOLN
INTRAVENOUS | Status: DC | PRN
  Administered 2015-04-10: 75 mg via INTRAVENOUS

## 2015-04-10 MED ORDER — SODIUM CHLORIDE 0.9 % IR SOLN
Status: DC | PRN
  Administered 2015-04-10: 1000 mL via INTRAVESICAL

## 2015-04-10 MED ORDER — CEFAZOLIN SODIUM-DEXTROSE 2-3 GM-% IV SOLR
INTRAVENOUS | Status: AC
  Filled 2015-04-10: qty 50

## 2015-04-10 MED ORDER — HYDROCHLOROTHIAZIDE 25 MG PO TABS
12.5000 mg | ORAL_TABLET | Freq: Every morning | ORAL | Status: DC
  Filled 2015-04-10: qty 0.5

## 2015-04-10 MED ORDER — MIDAZOLAM HCL 5 MG/5ML IJ SOLN
INTRAMUSCULAR | Status: DC | PRN
  Administered 2015-04-10 (×3): 0.5 mg via INTRAVENOUS
  Administered 2015-04-10: 1 mg via INTRAVENOUS
  Administered 2015-04-10 (×3): 0.5 mg via INTRAVENOUS

## 2015-04-10 MED ORDER — LACTATED RINGERS IV SOLN
INTRAVENOUS | Status: DC | PRN
  Administered 2015-04-10: 08:00:00

## 2015-04-10 MED ORDER — CEFAZOLIN SODIUM-DEXTROSE 2-3 GM-% IV SOLR
2.0000 g | INTRAVENOUS | Status: AC
  Administered 2015-04-10: 2 g via INTRAVENOUS

## 2015-04-10 MED ORDER — GLYCOPYRROLATE 0.2 MG/ML IJ SOLN
INTRAMUSCULAR | Status: DC | PRN
  Administered 2015-04-10: .8 mg via INTRAVENOUS

## 2015-04-10 MED ORDER — ONDANSETRON HCL 4 MG/2ML IJ SOLN
INTRAMUSCULAR | Status: DC | PRN
  Administered 2015-04-10 (×2): 2 mg via INTRAVENOUS

## 2015-04-10 MED ORDER — HYDROMORPHONE HCL 1 MG/ML IJ SOLN
0.2500 mg | INTRAMUSCULAR | Status: DC | PRN
  Administered 2015-04-10 (×3): 0.25 mg via INTRAVENOUS
  Administered 2015-04-10: 0.5 mg via INTRAVENOUS
  Administered 2015-04-10: 0.25 mg via INTRAVENOUS

## 2015-04-10 MED ORDER — NEOSTIGMINE METHYLSULFATE 10 MG/10ML IV SOLN
INTRAVENOUS | Status: DC | PRN
  Administered 2015-04-10: 5 mg via INTRAVENOUS

## 2015-04-10 MED ORDER — BUPIVACAINE-EPINEPHRINE 0.25% -1:200000 IJ SOLN
INTRAMUSCULAR | Status: DC | PRN
  Administered 2015-04-10: 29 mL

## 2015-04-10 MED ORDER — SODIUM CHLORIDE 0.9 % IV BOLUS (SEPSIS)
1000.0000 mL | Freq: Once | INTRAVENOUS | Status: AC
  Administered 2015-04-10: 1000 mL via INTRAVENOUS

## 2015-04-10 MED ORDER — ACETAMINOPHEN 325 MG PO TABS
650.0000 mg | ORAL_TABLET | ORAL | Status: DC | PRN
  Administered 2015-04-10 – 2015-04-11 (×2): 650 mg via ORAL
  Filled 2015-04-10 (×2): qty 2

## 2015-04-10 MED ORDER — ROCURONIUM BROMIDE 100 MG/10ML IV SOLN
INTRAVENOUS | Status: DC | PRN
  Administered 2015-04-10 (×4): 10 mg via INTRAVENOUS
  Administered 2015-04-10: 60 mg via INTRAVENOUS

## 2015-04-10 MED ORDER — HYDROCHLOROTHIAZIDE 12.5 MG PO CAPS
12.5000 mg | ORAL_CAPSULE | Freq: Every day | ORAL | Status: DC
  Administered 2015-04-11: 12.5 mg via ORAL
  Filled 2015-04-10 (×2): qty 1

## 2015-04-10 MED ORDER — CEFAZOLIN SODIUM 1-5 GM-% IV SOLN
1.0000 g | Freq: Three times a day (TID) | INTRAVENOUS | Status: AC
  Administered 2015-04-10 (×2): 1 g via INTRAVENOUS
  Filled 2015-04-10 (×2): qty 50

## 2015-04-10 SURGICAL SUPPLY — 52 items
CABLE HIGH FREQUENCY MONO STRZ (ELECTRODE) ×3 IMPLANT
CATH FOLEY 2WAY SLVR 18FR 30CC (CATHETERS) ×3 IMPLANT
CATH ROBINSON RED A/P 16FR (CATHETERS) ×3 IMPLANT
CATH ROBINSON RED A/P 8FR (CATHETERS) ×3 IMPLANT
CATH TIEMANN FOLEY 18FR 5CC (CATHETERS) ×3 IMPLANT
CHLORAPREP W/TINT 26ML (MISCELLANEOUS) ×3 IMPLANT
CLIP LIGATING HEM O LOK PURPLE (MISCELLANEOUS) ×9 IMPLANT
CLOTH BEACON ORANGE TIMEOUT ST (SAFETY) ×3 IMPLANT
COVER SURGICAL LIGHT HANDLE (MISCELLANEOUS) ×3 IMPLANT
COVER TIP SHEARS 8 DVNC (MISCELLANEOUS) ×2 IMPLANT
COVER TIP SHEARS 8MM DA VINCI (MISCELLANEOUS) ×1
CUTTER ECHEON FLEX ENDO 45 340 (ENDOMECHANICALS) ×3 IMPLANT
DECANTER SPIKE VIAL GLASS SM (MISCELLANEOUS) ×3 IMPLANT
DRAPE SURG IRRIG POUCH 19X23 (DRAPES) ×3 IMPLANT
DRSG TEGADERM 4X4.75 (GAUZE/BANDAGES/DRESSINGS) ×3 IMPLANT
DRSG TEGADERM 6X8 (GAUZE/BANDAGES/DRESSINGS) ×6 IMPLANT
ELECT REM PT RETURN 9FT ADLT (ELECTROSURGICAL) ×3
ELECTRODE REM PT RTRN 9FT ADLT (ELECTROSURGICAL) ×2 IMPLANT
GAUZE SPONGE 2X2 8PLY STRL LF (GAUZE/BANDAGES/DRESSINGS) ×2 IMPLANT
GLOVE BIO SURGEON STRL SZ 6.5 (GLOVE) ×3 IMPLANT
GLOVE BIOGEL M STRL SZ7.5 (GLOVE) ×6 IMPLANT
GOWN STRL REUS W/TWL LRG LVL3 (GOWN DISPOSABLE) ×9 IMPLANT
HOLDER FOLEY CATH W/STRAP (MISCELLANEOUS) ×3 IMPLANT
IV LACTATED RINGERS 1000ML (IV SOLUTION) ×3 IMPLANT
KIT ACCESSORY DA VINCI DISP (KITS) ×1
KIT ACCESSORY DVNC DISP (KITS) ×2 IMPLANT
LIQUID BAND (GAUZE/BANDAGES/DRESSINGS) ×3 IMPLANT
MANIFOLD NEPTUNE II (INSTRUMENTS) ×3 IMPLANT
NDL SAFETY ECLIPSE 18X1.5 (NEEDLE) ×2 IMPLANT
NEEDLE HYPO 18GX1.5 SHARP (NEEDLE) ×1
PACK ROBOT UROLOGY CUSTOM (CUSTOM PROCEDURE TRAY) ×3 IMPLANT
POUCH SPECIMEN RETRIEVAL 10MM (ENDOMECHANICALS) ×3 IMPLANT
RELOAD GREEN ECHELON 45 (STAPLE) ×3 IMPLANT
SET TUBE IRRIG SUCTION NO TIP (IRRIGATION / IRRIGATOR) ×3 IMPLANT
SHEET LAVH (DRAPES) IMPLANT
SOLUTION ELECTROLUBE (MISCELLANEOUS) ×3 IMPLANT
SPONGE GAUZE 2X2 STER 10/PKG (GAUZE/BANDAGES/DRESSINGS) ×1
SUT ETHILON 3 0 PS 1 (SUTURE) ×3 IMPLANT
SUT MNCRL 3 0 RB1 (SUTURE) ×2 IMPLANT
SUT MNCRL 3 0 VIOLET RB1 (SUTURE) ×2 IMPLANT
SUT MNCRL AB 4-0 PS2 18 (SUTURE) ×6 IMPLANT
SUT MONOCRYL 3 0 RB1 (SUTURE) ×2
SUT VIC AB 0 CT1 27 (SUTURE) ×1
SUT VIC AB 0 CT1 27XBRD ANTBC (SUTURE) ×2 IMPLANT
SUT VIC AB 0 UR5 27 (SUTURE) ×3 IMPLANT
SUT VIC AB 2-0 SH 27 (SUTURE) ×1
SUT VIC AB 2-0 SH 27X BRD (SUTURE) ×2 IMPLANT
SUT VICRYL 0 UR6 27IN ABS (SUTURE) ×6 IMPLANT
SYR 27GX1/2 1ML LL SAFETY (SYRINGE) ×3 IMPLANT
TOWEL OR 17X26 10 PK STRL BLUE (TOWEL DISPOSABLE) ×3 IMPLANT
TOWEL OR NON WOVEN STRL DISP B (DISPOSABLE) ×3 IMPLANT
WATER STERILE IRR 1500ML POUR (IV SOLUTION) ×6 IMPLANT

## 2015-04-10 NOTE — Anesthesia Postprocedure Evaluation (Signed)
  Anesthesia Post-op Note  Patient: Anthony Miles  Procedure(s) Performed: Procedure(s) (LRB): ROBOTIC ASSISTED LAPAROSCOPIC RADICAL PROSTATECTOMY LEVEL 2 (N/A) LYMPHADENECTOMY (Bilateral)  Patient Location: PACU  Anesthesia Type: General  Level of Consciousness: awake and alert   Airway and Oxygen Therapy: Patient Spontanous Breathing  Post-op Pain: mild  Post-op Assessment: Post-op Vital signs reviewed, Patient's Cardiovascular Status Stable, Respiratory Function Stable, Patent Airway and No signs of Nausea or vomiting  Last Vitals:  Filed Vitals:   04/10/15 1203  BP: 134/66  Pulse: 88  Temp: 36.6 C  Resp: 16    Post-op Vital Signs: stable   Complications: No apparent anesthesia complications

## 2015-04-10 NOTE — Transfer of Care (Signed)
Immediate Anesthesia Transfer of Care Note  Patient: Anthony Miles  Procedure(s) Performed: Procedure(s): ROBOTIC ASSISTED LAPAROSCOPIC RADICAL PROSTATECTOMY LEVEL 2 (N/A) LYMPHADENECTOMY (Bilateral)  Patient Location: PACU  Anesthesia Type:General  Level of Consciousness: awake, sedated, patient cooperative, lethargic and responds to stimulation  Airway & Oxygen Therapy: Patient Spontanous Breathing and Patient connected to face mask oxygen  Post-op Assessment: Report given to RN, Post -op Vital signs reviewed and stable and Patient moving all extremities  Post vital signs: Reviewed and stable  Last Vitals:  Filed Vitals:   04/10/15 0545  BP: 128/85  Pulse: 68  Temp: 36.6 C  Resp: 18    Complications: No apparent anesthesia complications

## 2015-04-10 NOTE — Discharge Instructions (Signed)

## 2015-04-10 NOTE — Progress Notes (Signed)
Post-op note  Subjective: The patient is doing well.  No complaints.  Denies N/V.  Objective: Vital signs in last 24 hours: Temp:  [97.6 F (36.4 C)-97.8 F (36.6 C)] 97.8 F (36.6 C) (10/06 1203) Pulse Rate:  [68-95] 88 (10/06 1203) Resp:  [12-20] 16 (10/06 1203) BP: (124-145)/(56-85) 134/66 mmHg (10/06 1203) SpO2:  [98 %-100 %] 99 % (10/06 1145) Weight:  [85.276 kg (188 lb)] 85.276 kg (188 lb) (10/06 0545)  Intake/Output from previous day:   Intake/Output this shift: Total I/O In: 3050 [I.V.:2050; IV Piggyback:1000] Out: 530 [Urine:100; Drains:55; Blood:375]  Physical Exam:  General: Alert and oriented. Abdomen: Soft, Nondistended. Incisions: Clean and dry. Urine: red  Lab Results:  Recent Labs  04/10/15 1116  HGB 12.5*  HCT 38.4*    Assessment/Plan: POD#0   1) Continue to monitor  2) DVT prophy, clears, IS, amb, pain control   LOS: 0 days   Anthony Miles 04/10/2015, 3:28 PM

## 2015-04-10 NOTE — Care Management Note (Signed)
Case Management Note  Patient Details  Name: JT BRABEC MRN: [id number] Date of Birth: [birth year]  Subjective/Objective:  69 y.o. M admitted for Lap assited Radical Prostatectomy for Adeno Carcinoma.    Lived with wife in Private residence pta. Anticipate return to home with wife at discharge               Action/Plan:Will follow for any CM needs.    Expected Discharge Date:                  Expected Discharge Plan:  Home/Self Care  In-House Referral:     Discharge planning Services  CM Consult  Post Acute Care Choice:    Choice offered to:     DME Arranged:    DME Agency:     HH Arranged:    HH Agency:     Status of Service:  In process, will continue to follow  Medicare Important Message Given:    Date Medicare IM Given:    Medicare IM give by:    Date Additional Medicare IM Given:    Additional Medicare Important Message give by:     If discussed at [place] of Stay Meetings, dates discussed:    Additional Comments:  Delrae Sawyers, RN 04/10/2015, 2:10 PM

## 2015-04-10 NOTE — Op Note (Signed)
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 Robotic assisted laparoscopic radical prostatectomy (bilateral nerve sparing) 2. Bilateral robotic assisted laparoscopic pelvic lymphadenectomy  Surgeon: Pryor Curia. M.D.  Assistant: Debbrah Alar, PA-C  Anesthesia: General  Complications: None  EBL: 200 mL  IVF:  1800 mL crystalloid  Specimens: 1. Prostate and seminal vesicles 2. Right pelvic lymph nodes 3. Left pelvic lymph nodes  Disposition of specimens: Pathology  Drains: 1. 20 Fr coude catheter 2. # 19 Blake pelvic drain  Indication: Anthony Miles is a 69 y.o. year old patient with clinically localized prostate cancer.  After a thorough review of the management options for treatment of prostate cancer, he elected to proceed with surgical therapy and the above procedure(s).  We have discussed the potential benefits and risks of the procedure, side effects of the proposed treatment, the likelihood of the patient achieving the goals of the procedure, and any potential problems that might occur during the procedure or recuperation. Informed consent has been obtained.  Description of procedure:  The patient was taken to the operating room and a general anesthetic was administered. He was given preoperative antibiotics, placed in the dorsal lithotomy position, and prepped and draped in the usual sterile fashion. Next a preoperative timeout was performed. A urethral catheter was placed into the bladder and a site was selected near the umbilicus for placement of the camera port. This was placed using a standard open Hassan technique which allowed entry into the peritoneal cavity under direct vision and without difficulty. A 12 mm port was placed and a pneumoperitoneum established. The camera was then  used to inspect the abdomen and there was no evidence of any intra-abdominal injuries or other abnormalities. The remaining abdominal ports were then placed. 8 mm robotic ports were placed in the right lower quadrant, left lower quadrant, and far left lateral abdominal wall. A 5 mm port was placed in the right upper quadrant and a 12 mm port was placed in the right lateral abdominal wall for laparoscopic assistance. All ports were placed under direct vision without difficulty. The surgical cart was then docked.   Utilizing the cautery scissors, the bladder was reflected posteriorly allowing entry into the space of Retzius and identification of the endopelvic fascia and prostate. The periprostatic fat was then removed from the prostate allowing full exposure of the endopelvic fascia. The endopelvic fascia was then incised from the apex back to the base of the prostate bilaterally and the underlying levator muscle fibers were swept laterally off the prostate thereby isolating the dorsal venous complex. The dorsal vein was then stapled and divided with a 45 mm Flex Echelon stapler. Attention then turned to the bladder neck which was divided anteriorly thereby allowing entry into the bladder and exposure of the urethral catheter. The catheter balloon was deflated and the catheter was brought into the operative field and used to retract the prostate anteriorly. The posterior bladder neck was then examined and was divided allowing further dissection between the bladder and prostate posteriorly until the vasa deferentia and seminal vessels were identified. The vasa deferentia were isolated, divided, and lifted anteriorly. The seminal vesicles were dissected down to their tips with care to control the seminal vascular arterial blood supply. These structures were then lifted anteriorly and the space between Denonvillier's fascia and the anterior rectum was developed with a combination  of sharp and blunt dissection. This  isolated the vascular pedicles of the prostate.  The lateral prostatic fascia was then sharply incised allowing release of the neurovascular bundles bilaterally.  A partial left nerve spare and full right nerve spare was performed due to the prominent disease on the left side of the prostate. The vascular pedicles of the prostate were then ligated with Weck clips between the prostate and neurovascular bundles and divided with sharp cold scissor dissection resulting in neurovascular bundle preservation. The neurovascular bundles were then separated off the apex of the prostate and urethra bilaterally.  The urethra was then sharply transected allowing the prostate specimen to be disarticulated. The pelvis was copiously irrigated and hemostasis was ensured. There was no evidence for rectal injury.  Attention then turned to the right pelvic sidewall. The fibrofatty tissue between the external iliac vein, confluence of the iliac vessels, hypogastric artery, and Cooper's ligament was dissected free from the pelvic sidewall with care to preserve the obturator nerve. Weck clips were used for lymphostasis and hemostasis. An identical procedure was performed on the contralateral side and the lymphatic packets were removed for permanent pathologic analysis.  Attention then turned to the urethral anastomosis. A 2-0 Vicryl slip knot was placed between Denonvillier's fascia, the posterior bladder neck, and the posterior urethra to reapproximate these structures. A double-armed 3-0 Monocryl suture was then used to perform a 360 running tension-free anastomosis between the bladder neck and urethra. A new urethral catheter was then placed into the bladder and irrigated. There were no blood clots within the bladder and the anastomosis appeared to be watertight. A #19 Blake drain was then brought through the left lateral 8 mm port site and positioned appropriately within the pelvis. It was secured to the skin with a nylon  suture. The surgical cart was then undocked. The right lateral 12 mm port site was closed at the fascial level with a 0 Vicryl suture placed laparoscopically. All remaining ports were then removed under direct vision. The prostate specimen was removed intact within the Endopouch retrieval bag via the periumbilical camera port site. This fascial opening was closed with two running 0 Vicryl sutures. 0.25% Marcaine was then injected into all port sites and all incisions were reapproximated at the skin level with 4-0 Monocryl subcuticular sutures and Dermabond. The patient appeared to tolerate the procedure well and without complications. The patient was able to be extubated and transferred to the recovery unit in satisfactory condition.   Pryor Curia MD

## 2015-04-10 NOTE — Progress Notes (Signed)
Utilization review completed.  

## 2015-04-10 NOTE — Anesthesia Procedure Notes (Signed)
Procedure Name: Intubation Date/Time: 04/10/2015 7:32 AM Performed by: Ofilia Neas Pre-anesthesia Checklist: Patient identified, Emergency Drugs available, Suction available, Patient being monitored and Timeout performed Patient Re-evaluated:Patient Re-evaluated prior to inductionOxygen Delivery Method: Circle system utilized Preoxygenation: Pre-oxygenation with 100% oxygen Intubation Type: IV induction and Cricoid Pressure applied Ventilation: Mask ventilation without difficulty and Mask ventilation throughout procedure Laryngoscope Size: Mac and 4 Grade View: Grade III Tube type: Oral Tube size: 7.5 mm Number of attempts: 2 Airway Equipment and Method: Stylet Placement Confirmation: ETT inserted through vocal cords under direct vision,  breath sounds checked- equal and bilateral and positive ETCO2 Secured at: 22 cm Tube secured with: Tape Dental Injury: Teeth and Oropharynx as per pre-operative assessment  Difficulty Due To: Difficulty was unanticipated and Difficult Airway- due to anterior larynx Future Recommendations: Recommend- induction with short-acting agent, and alternative techniques readily available

## 2015-04-11 LAB — HEMOGLOBIN AND HEMATOCRIT, BLOOD
HCT: 36.2 % — ABNORMAL LOW (ref 39.0–52.0)
Hemoglobin: 12.2 g/dL — ABNORMAL LOW (ref 13.0–17.0)

## 2015-04-11 MED ORDER — BISACODYL 10 MG RE SUPP
10.0000 mg | Freq: Once | RECTAL | Status: AC
  Administered 2015-04-11: 10 mg via RECTAL
  Filled 2015-04-11: qty 1

## 2015-04-11 MED ORDER — HYDROCODONE-ACETAMINOPHEN 5-325 MG PO TABS: 1.0000 | ORAL_TABLET | Freq: Four times a day (QID) | ORAL | Status: DC | PRN

## 2015-04-11 NOTE — Discharge Summary (Signed)
  Date of admission: 04/10/2015  Date of discharge: 04/11/2015  Admission diagnosis: Prostate Cancer  Discharge diagnosis: Prostate Cancer  History and Physical: For full details, please see admission history and physical. Briefly, Anthony Miles is a 69 y.o. gentleman with localized prostate cancer.  After discussing management/treatment options, he elected to proceed with surgical treatment.  Hospital Course: Anthony Miles was taken to the operating room on 04/10/2015 and underwent a robotic assisted laparoscopic radical prostatectomy. He tolerated this procedure well and without complications. Postoperatively, he was able to be transferred to a regular hospital room following recovery from anesthesia.  He was able to begin ambulating the night of surgery. He remained hemodynamically stable overnight.  He had excellent urine output with appropriately minimal output from his pelvic drain and his pelvic drain was removed on POD #1.  He was transitioned to oral pain medication, tolerated a clear liquid diet, and had met all discharge criteria and was able to be discharged home later on POD#1.  Laboratory values:  Recent Labs  04/10/15 1116 04/11/15 0609  HGB 12.5* 12.2*  HCT 38.4* 36.2*    Disposition: Home  Discharge instruction: He was instructed to be ambulatory but to refrain from heavy lifting, strenuous activity, or driving. He was instructed on urethral catheter care.  Discharge medications:     Medication List    STOP taking these medications        MULTIVITAMIN PO     naproxen sodium 220 MG tablet  Commonly known as:  ANAPROX      TAKE these medications        hydrochlorothiazide 12.5 MG tablet  Commonly known as:  HYDRODIURIL  Take 12.5 mg by mouth every morning.     HYDROcodone-acetaminophen 5-325 MG tablet  Commonly known as:  NORCO  Take 1-2 tablets by mouth every 6 (six) hours as needed.     lisinopril 10 MG tablet  Commonly known as:  PRINIVIL,ZESTRIL   Take 10 mg by mouth at bedtime.     rosuvastatin 5 MG tablet  Commonly known as:  CRESTOR  Take 5 mg by mouth at bedtime.     sulfamethoxazole-trimethoprim 800-160 MG tablet  Commonly known as:  BACTRIM DS,SEPTRA DS  Take 1 tablet by mouth 2 (two) times daily. Start the day prior to foley removal appointment        Followup: He will followup in 1 week for catheter removal and to discuss his surgical pathology results.

## 2015-04-11 NOTE — Progress Notes (Signed)
Patient ID: Anthony Miles, male   DOB: 1946/02/26, 69 y.o.   MRN: [id number]  1 Day Post-Op Subjective: The patient is doing well.  No nausea or vomiting. Pain is adequately controlled.  Objective: Vital signs in last 24 hours: Temp:  [97.6 F (36.4 C)-98.1 F (36.7 C)] 97.9 F (36.6 C) (10/07 0603) Pulse Rate:  [63-95] 63 (10/07 0603) Resp:  [12-20] 18 (10/07 0603) BP: (111-145)/(55-70) 111/56 mmHg (10/07 0603) SpO2:  [94 %-100 %] 98 % (10/07 0603)  Intake/Output from previous day: 10/06 0701 - 10/07 0700 In: 5685 [I.V.:4635; IV Piggyback:1050] Out: 1900 [Urine:1350; Drains:175; Blood:375] Intake/Output this shift:    Physical Exam:  General: Alert and oriented. CV: RRR Lungs: Clear bilaterally. GI: Soft, Nondistended. Incisions: Clean, dry, and intact Urine: Clear Extremities: Nontender, no erythema, no edema.  Lab Results:  Recent Labs  04/10/15 1116 04/11/15 0609  HGB 12.5* 12.2*  HCT 38.4* 36.2*      Assessment/Plan: POD# 1 s/p robotic prostatectomy.  1) SL IVF 2) Ambulate, Incentive spirometry 3) Transition to oral pain medication 4) Dulcolax suppository 5) D/C pelvic drain 6) Plan for likely discharge later today   Anthony Miles. MD   LOS: 1 day   Anthony Miles,LES 04/11/2015, 7:24 AM

## 2015-04-11 NOTE — Progress Notes (Signed)
Discharge teaching given to pt including activity, diet, medications, and follow-up appts. Pt verbalized understanding of all discharge instructions. IV access was d/c'd. Vitals are stable. Pt to be escorted out by NT, to be driven home by family.

## 2015-05-12 DIAGNOSIS — M6281 Muscle weakness (generalized): Secondary | ICD-10-CM | POA: Diagnosis not present

## 2015-05-12 DIAGNOSIS — R278 Other lack of coordination: Secondary | ICD-10-CM | POA: Diagnosis not present

## 2015-05-12 DIAGNOSIS — N393 Stress incontinence (female) (male): Secondary | ICD-10-CM | POA: Diagnosis not present

## 2015-05-26 DIAGNOSIS — M6281 Muscle weakness (generalized): Secondary | ICD-10-CM | POA: Diagnosis not present

## 2015-05-26 DIAGNOSIS — R278 Other lack of coordination: Secondary | ICD-10-CM | POA: Diagnosis not present

## 2015-05-26 DIAGNOSIS — N393 Stress incontinence (female) (male): Secondary | ICD-10-CM | POA: Diagnosis not present

## 2015-06-06 DIAGNOSIS — L821 Other seborrheic keratosis: Secondary | ICD-10-CM | POA: Diagnosis not present

## 2015-06-06 DIAGNOSIS — Z85828 Personal history of other malignant neoplasm of skin: Secondary | ICD-10-CM | POA: Diagnosis not present

## 2015-06-06 DIAGNOSIS — D1801 Hemangioma of skin and subcutaneous tissue: Secondary | ICD-10-CM | POA: Diagnosis not present

## 2015-06-06 DIAGNOSIS — L570 Actinic keratosis: Secondary | ICD-10-CM | POA: Diagnosis not present

## 2015-06-06 DIAGNOSIS — D1721 Benign lipomatous neoplasm of skin and subcutaneous tissue of right arm: Secondary | ICD-10-CM | POA: Diagnosis not present

## 2015-06-06 DIAGNOSIS — C61 Malignant neoplasm of prostate: Secondary | ICD-10-CM | POA: Diagnosis not present

## 2015-06-06 DIAGNOSIS — L812 Freckles: Secondary | ICD-10-CM | POA: Diagnosis not present

## 2015-06-09 DIAGNOSIS — Z23 Encounter for immunization: Secondary | ICD-10-CM | POA: Diagnosis not present

## 2015-07-04 DIAGNOSIS — H25813 Combined forms of age-related cataract, bilateral: Secondary | ICD-10-CM | POA: Diagnosis not present

## 2015-07-24 DIAGNOSIS — H2513 Age-related nuclear cataract, bilateral: Secondary | ICD-10-CM | POA: Diagnosis not present

## 2015-08-12 DIAGNOSIS — E784 Other hyperlipidemia: Secondary | ICD-10-CM | POA: Diagnosis not present

## 2015-08-12 DIAGNOSIS — I10 Essential (primary) hypertension: Secondary | ICD-10-CM | POA: Diagnosis not present

## 2015-08-12 DIAGNOSIS — Z125 Encounter for screening for malignant neoplasm of prostate: Secondary | ICD-10-CM | POA: Diagnosis not present

## 2015-08-12 DIAGNOSIS — R7301 Impaired fasting glucose: Secondary | ICD-10-CM | POA: Diagnosis not present

## 2015-08-19 DIAGNOSIS — N529 Male erectile dysfunction, unspecified: Secondary | ICD-10-CM | POA: Diagnosis not present

## 2015-08-19 DIAGNOSIS — Z1389 Encounter for screening for other disorder: Secondary | ICD-10-CM | POA: Diagnosis not present

## 2015-08-19 DIAGNOSIS — Z0000 Encounter for general adult medical examination without abnormal findings: Secondary | ICD-10-CM | POA: Diagnosis not present

## 2015-08-19 DIAGNOSIS — Z6827 Body mass index (BMI) 27.0-27.9, adult: Secondary | ICD-10-CM | POA: Diagnosis not present

## 2015-08-19 DIAGNOSIS — E784 Other hyperlipidemia: Secondary | ICD-10-CM | POA: Diagnosis not present

## 2015-08-19 DIAGNOSIS — G4709 Other insomnia: Secondary | ICD-10-CM | POA: Diagnosis not present

## 2015-08-19 DIAGNOSIS — R7301 Impaired fasting glucose: Secondary | ICD-10-CM | POA: Diagnosis not present

## 2015-08-19 DIAGNOSIS — Z86010 Personal history of colonic polyps: Secondary | ICD-10-CM | POA: Diagnosis not present

## 2015-08-19 DIAGNOSIS — Z8546 Personal history of malignant neoplasm of prostate: Secondary | ICD-10-CM | POA: Diagnosis not present

## 2015-08-19 DIAGNOSIS — I10 Essential (primary) hypertension: Secondary | ICD-10-CM | POA: Diagnosis not present

## 2015-08-22 DIAGNOSIS — Z1212 Encounter for screening for malignant neoplasm of rectum: Secondary | ICD-10-CM | POA: Diagnosis not present

## 2015-09-04 DIAGNOSIS — H269 Unspecified cataract: Secondary | ICD-10-CM | POA: Diagnosis not present

## 2015-09-04 DIAGNOSIS — H2512 Age-related nuclear cataract, left eye: Secondary | ICD-10-CM | POA: Diagnosis not present

## 2015-09-05 DIAGNOSIS — H2511 Age-related nuclear cataract, right eye: Secondary | ICD-10-CM | POA: Diagnosis not present

## 2015-09-11 DIAGNOSIS — H2512 Age-related nuclear cataract, left eye: Secondary | ICD-10-CM | POA: Diagnosis not present

## 2015-10-07 DIAGNOSIS — H2511 Age-related nuclear cataract, right eye: Secondary | ICD-10-CM | POA: Diagnosis not present

## 2015-10-07 DIAGNOSIS — H269 Unspecified cataract: Secondary | ICD-10-CM | POA: Diagnosis not present

## 2015-12-10 DIAGNOSIS — N5231 Erectile dysfunction following radical prostatectomy: Secondary | ICD-10-CM | POA: Diagnosis not present

## 2015-12-10 DIAGNOSIS — C61 Malignant neoplasm of prostate: Secondary | ICD-10-CM | POA: Diagnosis not present

## 2016-01-14 DIAGNOSIS — Z961 Presence of intraocular lens: Secondary | ICD-10-CM | POA: Diagnosis not present

## 2016-05-18 DIAGNOSIS — Z23 Encounter for immunization: Secondary | ICD-10-CM | POA: Diagnosis not present

## 2016-06-07 DIAGNOSIS — L812 Freckles: Secondary | ICD-10-CM | POA: Diagnosis not present

## 2016-06-07 DIAGNOSIS — L821 Other seborrheic keratosis: Secondary | ICD-10-CM | POA: Diagnosis not present

## 2016-06-07 DIAGNOSIS — D1801 Hemangioma of skin and subcutaneous tissue: Secondary | ICD-10-CM | POA: Diagnosis not present

## 2016-06-07 DIAGNOSIS — L570 Actinic keratosis: Secondary | ICD-10-CM | POA: Diagnosis not present

## 2016-06-07 DIAGNOSIS — Z85828 Personal history of other malignant neoplasm of skin: Secondary | ICD-10-CM | POA: Diagnosis not present

## 2016-06-08 DIAGNOSIS — D1721 Benign lipomatous neoplasm of skin and subcutaneous tissue of right arm: Secondary | ICD-10-CM | POA: Diagnosis not present

## 2016-06-08 DIAGNOSIS — Z85828 Personal history of other malignant neoplasm of skin: Secondary | ICD-10-CM | POA: Diagnosis not present

## 2016-07-09 DIAGNOSIS — N5201 Erectile dysfunction due to arterial insufficiency: Secondary | ICD-10-CM | POA: Diagnosis not present

## 2016-07-09 DIAGNOSIS — Z8546 Personal history of malignant neoplasm of prostate: Secondary | ICD-10-CM | POA: Diagnosis not present

## 2016-07-13 ENCOUNTER — Encounter: Payer: Self-pay | Admitting: Internal Medicine

## 2016-07-15 ENCOUNTER — Encounter: Payer: Self-pay | Admitting: Internal Medicine

## 2016-08-18 DIAGNOSIS — Z0000 Encounter for general adult medical examination without abnormal findings: Secondary | ICD-10-CM | POA: Diagnosis not present

## 2016-08-18 DIAGNOSIS — E784 Other hyperlipidemia: Secondary | ICD-10-CM | POA: Diagnosis not present

## 2016-08-18 DIAGNOSIS — R7301 Impaired fasting glucose: Secondary | ICD-10-CM | POA: Diagnosis not present

## 2016-08-18 DIAGNOSIS — Z125 Encounter for screening for malignant neoplasm of prostate: Secondary | ICD-10-CM | POA: Diagnosis not present

## 2016-08-18 DIAGNOSIS — I10 Essential (primary) hypertension: Secondary | ICD-10-CM | POA: Diagnosis not present

## 2016-08-25 DIAGNOSIS — N528 Other male erectile dysfunction: Secondary | ICD-10-CM | POA: Diagnosis not present

## 2016-08-25 DIAGNOSIS — Z86010 Personal history of colonic polyps: Secondary | ICD-10-CM | POA: Diagnosis not present

## 2016-08-25 DIAGNOSIS — Z1389 Encounter for screening for other disorder: Secondary | ICD-10-CM | POA: Diagnosis not present

## 2016-08-25 DIAGNOSIS — I10 Essential (primary) hypertension: Secondary | ICD-10-CM | POA: Diagnosis not present

## 2016-08-25 DIAGNOSIS — Z0000 Encounter for general adult medical examination without abnormal findings: Secondary | ICD-10-CM | POA: Diagnosis not present

## 2016-08-25 DIAGNOSIS — E784 Other hyperlipidemia: Secondary | ICD-10-CM | POA: Diagnosis not present

## 2016-08-25 DIAGNOSIS — Z8546 Personal history of malignant neoplasm of prostate: Secondary | ICD-10-CM | POA: Diagnosis not present

## 2016-08-25 DIAGNOSIS — G4709 Other insomnia: Secondary | ICD-10-CM | POA: Diagnosis not present

## 2016-08-25 DIAGNOSIS — R7301 Impaired fasting glucose: Secondary | ICD-10-CM | POA: Diagnosis not present

## 2016-08-25 DIAGNOSIS — Z6827 Body mass index (BMI) 27.0-27.9, adult: Secondary | ICD-10-CM | POA: Diagnosis not present

## 2016-09-08 DIAGNOSIS — Z961 Presence of intraocular lens: Secondary | ICD-10-CM | POA: Diagnosis not present

## 2016-09-08 DIAGNOSIS — H04123 Dry eye syndrome of bilateral lacrimal glands: Secondary | ICD-10-CM | POA: Diagnosis not present

## 2016-09-14 ENCOUNTER — Ambulatory Visit (AMBULATORY_SURGERY_CENTER): Payer: Self-pay

## 2016-09-14 VITALS — Ht 69.0 in | Wt 192.2 lb

## 2016-09-14 DIAGNOSIS — Z86010 Personal history of colonic polyps: Secondary | ICD-10-CM

## 2016-09-14 MED ORDER — NA SULFATE-K SULFATE-MG SULF 17.5-3.13-1.6 GM/177ML PO SOLN: ORAL | 0 refills | Status: DC

## 2016-09-14 NOTE — Progress Notes (Signed)
Per pt, no allergies to soy or egg products.Pt not taking any weight loss meds or using  O2 at home. 

## 2016-09-15 ENCOUNTER — Telehealth: Payer: Self-pay | Admitting: Internal Medicine

## 2016-09-15 DIAGNOSIS — Z1211 Encounter for screening for malignant neoplasm of colon: Secondary | ICD-10-CM

## 2016-09-15 MED ORDER — SUPREP BOWEL PREP KIT 17.5-3.13-1.6 GM/177ML PO SOLN: 1.0000 | Freq: Once | ORAL | 0 refills | Status: AC

## 2016-09-28 ENCOUNTER — Encounter: Payer: Self-pay | Admitting: Internal Medicine

## 2016-09-28 ENCOUNTER — Ambulatory Visit (AMBULATORY_SURGERY_CENTER): Payer: Medicare Other | Admitting: Internal Medicine

## 2016-09-28 VITALS — BP 99/56 | HR 49 | Temp 98.0°F | Resp 12 | Ht 69.0 in | Wt 192.0 lb

## 2016-09-28 DIAGNOSIS — K635 Polyp of colon: Secondary | ICD-10-CM | POA: Diagnosis not present

## 2016-09-28 DIAGNOSIS — D124 Benign neoplasm of descending colon: Secondary | ICD-10-CM

## 2016-09-28 DIAGNOSIS — Z86010 Personal history of colonic polyps: Secondary | ICD-10-CM

## 2016-09-28 DIAGNOSIS — D122 Benign neoplasm of ascending colon: Secondary | ICD-10-CM | POA: Diagnosis not present

## 2016-09-28 MED ORDER — SODIUM CHLORIDE 0.9 % IV SOLN: 500.0000 mL | INTRAVENOUS | Status: AC

## 2016-09-28 NOTE — Progress Notes (Signed)
Called to room to assist during endoscopic procedure.  Patient ID and intended procedure confirmed with present staff. Received instructions for my participation in the procedure from the performing physician.  

## 2016-09-28 NOTE — Op Note (Signed)
[facility] Patient Name: Keygan Dumond Procedure Date: 09/28/2016 10:59 AM MRN: [id number] Endoscopist: Docia Chuck. Henrene Pastor , MD Age: 71 Referring MD:  Date of Birth: Mar 01, 1946 Gender: Male Account #: [id number] Procedure:                Colonoscopy, with cold snare polypectomy X2 Indications:              Surveillance: Personal history of adenomatous                            polyps on last colonoscopy 3 years ago, High risk                            colon cancer surveillance: Personal history of                            multiple (3 or more) adenomas. Remote exam [place] about 14 years ago. Last exam here January                            2015 with 4 tubular adenomas Medicines:                Monitored Anesthesia Care Procedure:                Pre-Anesthesia Assessment:                           - Prior to the procedure, a History and Physical                            was performed, and patient medications and                            allergies were reviewed. The patient's tolerance of                            previous anesthesia was also reviewed. The risks                            and benefits of the procedure and the sedation                            options and risks were discussed with the patient.                            All questions were answered, and informed consent                            was obtained. Prior Anticoagulants: The patient has                            taken no previous anticoagulant or antiplatelet  agents. ASA Grade Assessment: II - A patient with                            mild systemic disease. After reviewing the risks                            and benefits, the patient was deemed in                            satisfactory condition to undergo the procedure.                           After obtaining informed consent, the colonoscope                            was  passed under direct vision. Throughout the                            procedure, the patient's blood pressure, pulse, and                            oxygen saturations were monitored continuously. The                            Colonoscope was introduced through the anus and                            advanced to the the cecum, identified by                            appendiceal orifice and ileocecal valve. The                            ileocecal valve, appendiceal orifice, and rectum                            were photographed. The quality of the bowel                            preparation was excellent. The colonoscopy was                            performed without difficulty. The patient tolerated                            the procedure well. The bowel preparation used was                            SUPREP. Scope In: 11:14:47 AM Scope Out: 11:28:53 AM Scope Withdrawal Time: 0 hours 12 minutes 31 seconds  Total Procedure Duration: 0 hours 14 minutes 6 seconds  Findings:                 Two polyps were found in the descending colon and  ascending colon. The polyps were 1 to 3 mm in size.                            These polyps were removed with a cold snare.                            Resection and retrieval were complete.                           The exam was otherwise without abnormality on                            direct and retroflexion views. Complications:            No immediate complications. Estimated blood loss:                            None. Estimated Blood Loss:     Estimated blood loss: none. Impression:               - Two 1 to 3 mm polyps in the descending colon and                            in the ascending colon, removed with a cold snare.                            Resected and retrieved.                           - The examination was otherwise normal on direct                            and retroflexion views. Recommendation:            - Repeat colonoscopy in 5 years for surveillance.                           - Patient has a contact number available for                            emergencies. The signs and symptoms of potential                            delayed complications were discussed with the                            patient. Return to normal activities tomorrow.                            Written discharge instructions were provided to the                            patient.                           - Resume  previous diet.                           - Continue present medications.                           - Await pathology results. Docia Chuck. Henrene Pastor, MD 09/28/2016 11:33:08 AM This report has been signed electronically.

## 2016-09-28 NOTE — Patient Instructions (Signed)
YOU HAD AN ENDOSCOPIC PROCEDURE TODAY AT [facility]:   Refer to the procedure report that was given to you for any specific questions about what was found during the examination.  If the procedure report does not answer your questions, please call your gastroenterologist to clarify.  If you requested that your care partner not be given the details of your procedure findings, then the procedure report has been included in a sealed envelope for you to review at your convenience later.  YOU SHOULD EXPECT: Some feelings of bloating in the abdomen. Passage of more gas than usual.  Walking can help get rid of the air that was put into your GI tract during the procedure and reduce the bloating. If you had a lower endoscopy (such as a colonoscopy or flexible sigmoidoscopy) you may notice spotting of blood in your stool or on the toilet paper. If you underwent a bowel prep for your procedure, you may not have a normal bowel movement for a few days.  Please Note:  You might notice some irritation and congestion in your nose or some drainage.  This is from the oxygen used during your procedure.  There is no need for concern and it should clear up in a day or so.  SYMPTOMS TO REPORT IMMEDIATELY:   Following lower endoscopy (colonoscopy or flexible sigmoidoscopy):  Excessive amounts of blood in the stool  Significant tenderness or worsening of abdominal pains  Swelling of the abdomen that is new, acute  Fever of 100F or higher    For urgent or emergent issues, a gastroenterologist can be reached at any hour by calling [phone].   DIET:  We do recommend a small meal at first, but then you may proceed to your regular diet.  Drink plenty of fluids but you should avoid alcoholic beverages for 24 hours.  ACTIVITY:  You should plan to take it easy for the rest of today and you should NOT DRIVE or use heavy machinery until tomorrow (because of the sedation medicines used during the test).     FOLLOW UP: Our staff will call the number listed on your records the next business day following your procedure to check on you and address any questions or concerns that you may have regarding the information given to you following your procedure. If we do not reach you, we will leave a message.  However, if you are feeling well and you are not experiencing any problems, there is no need to return our call.  We will assume that you have returned to your regular daily activities without incident.  If any biopsies were taken you will be contacted by phone or by letter within the next 1-3 weeks.  Please call us at [phone] if you have not heard about the biopsies in 3 weeks.    SIGNATURES/CONFIDENTIALITY: You and/or your care partner have signed paperwork which will be entered into your electronic medical record.  These signatures attest to the fact that that the information above on your After Visit Summary has been reviewed and is understood.  Full responsibility of the confidentiality of this discharge information lies with you and/or your care-partner.   INFORMATION ON POLYPS GIVEN TO YOU TODAY  REPEAT COLONOSCOPY IN 5 YEARS

## 2016-09-28 NOTE — Progress Notes (Signed)
TO PACU VSS Report to RN

## 2016-09-29 ENCOUNTER — Telehealth: Payer: Self-pay

## 2016-09-29 NOTE — Telephone Encounter (Signed)
  Follow up Call-  Call back number 09/28/2016  Post procedure Call Back phone  # [phone]  Permission to leave phone message Yes  Some recent data might be hidden     Patient questions:  Do you have a fever, pain , or abdominal swelling? No. Pain Score  0 *  Have you tolerated food without any problems? Yes.    Have you been able to return to your normal activities? Yes.    Do you have any questions about your discharge instructions: Diet   No. Medications  No. Follow up visit  No.  Do you have questions or concerns about your Care? No.  Actions: * If pain score is 4 or above: No action needed, pain <4.  No problems noted per pt. maw

## 2016-10-01 ENCOUNTER — Encounter: Payer: Self-pay | Admitting: Internal Medicine

## 2016-10-12 NOTE — Telephone Encounter (Signed)
Done

## 2017-01-19 IMAGING — MR MR PROSTATE WO/W CM
23 of 53 series · 23 of 53 positions shown · IV contrast (yes)
Comparison: Biopsy of 11/16/2013

CLINICAL DATA: Prostate cancer, initially diagnosed in 9828. Prior
biopsies x4. Elevated PSA with biopsy planned for tomorrow. History
of prostatitis.

EXAM:
MR PROSTATE WITHOUT AND WITH CONTRAST
TECHNIQUE: Multiplanar multisequence MRI images were obtained of the pelvis
centered about the prostate. Pre and post contrast images were
obtained.
CONTRAST:  17mL MULTIHANCE GADOBENATE DIMEGLUMINE 529 MG/ML IV SOLN

[Series 3: bSSFP fat-sat · axial · 6.0mm · 0.86mm/px · 1 of 44 slices shown]
[im 1/44]
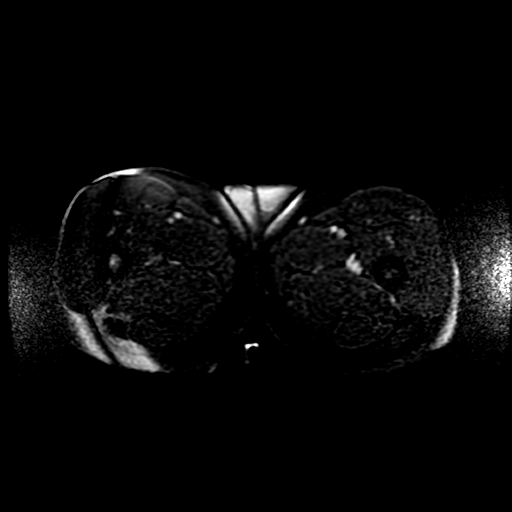

[Series 4: T1 · axial · 6.0mm · 0.86mm/px · 1 of 44 slices shown (1 of 2)]
[im 1/44]
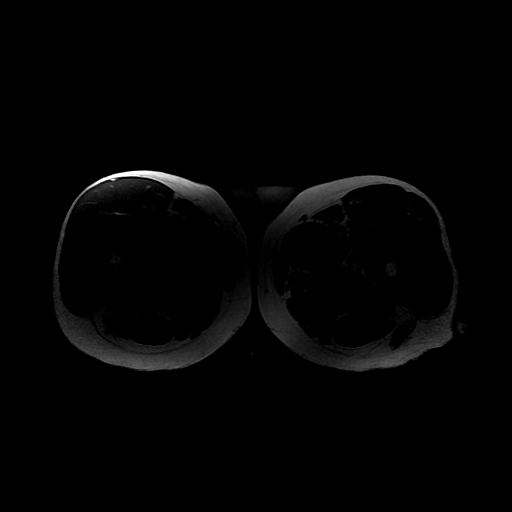

[Series 5: T2 · axial · 3.0mm · 0.29mm/px · 1 of 28 slices shown (1 of 3)]
[im 1/28]
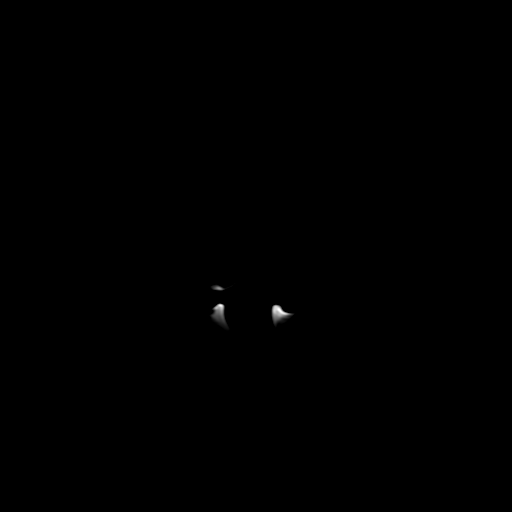

[Series 6: T1 · axial · 3.0mm · 0.29mm/px · 1 of 28 slices shown (2 of 2)]
[im 1/28]
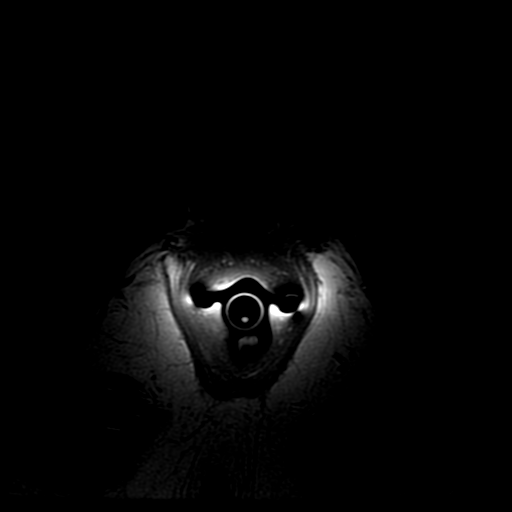

[Series 7: T2 · sagittal · 4.0mm · 0.29mm/px · 1 of 25 slices shown (2 of 3)]
[im 1/25]
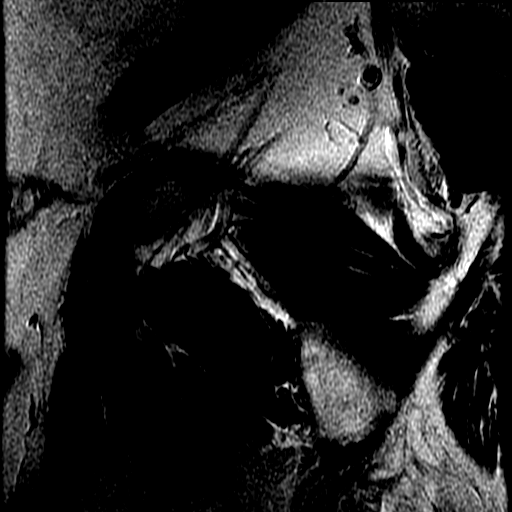

[Series 8: T2 · coronal · 4.0mm · 0.29mm/px · 1 of 22 slices shown (3 of 3)]
[im 1/22]
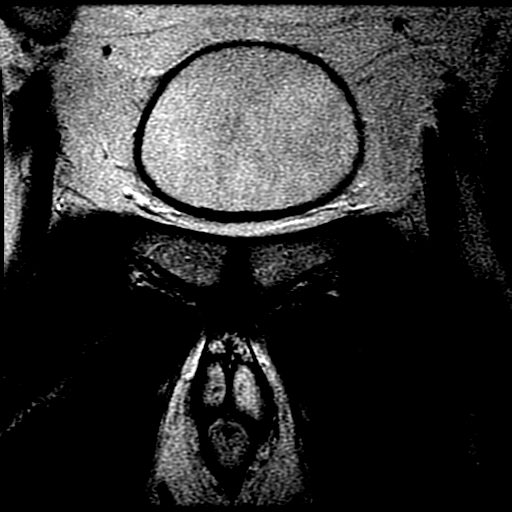

[Series 9: DWI · axial · 3.0mm · 0.59mm/px · 1 of 54 slices shown (1 of 6)]
[im 1/54]
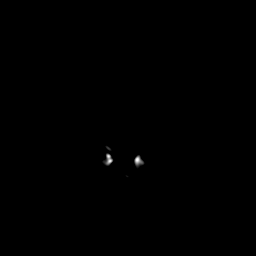

[Series 10: DWI · axial · 3.0mm · 0.59mm/px · 1 of 55 slices shown (2 of 6)]
[im 1/55]
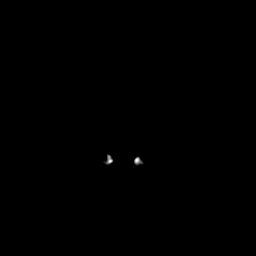

[Series 11: DWI · axial · 3.0mm · 0.59mm/px · 1 of 55 slices shown (3 of 6)]
[im 1/55]
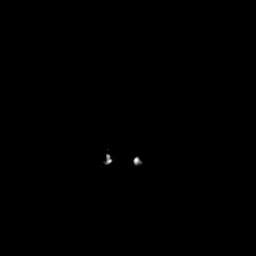

[Series 900: DWI · axial · 3.0mm · 0.59mm/px · 1 of 28 slices shown (4 of 6)]
[im 1/28]
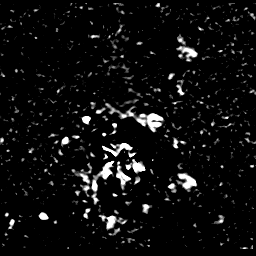

[Series 1000: DWI · axial · 3.0mm · 0.59mm/px · 1 of 28 slices shown (5 of 6)]
[im 1/28]
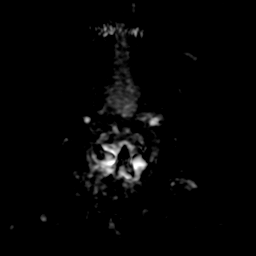

[Series 1100: DWI · axial · 3.0mm · 0.59mm/px · 1 of 28 slices shown (6 of 6)]
[im 1/28]
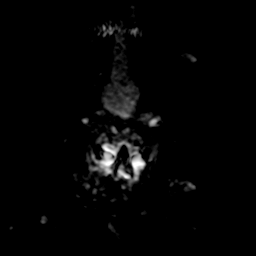

[((id)/(id)/1)-((id)/(id)/1) · axial · 3.0mm · 0.43mm/px · 1 of 74 slices shown (1 of 11)]
[im 1/74]
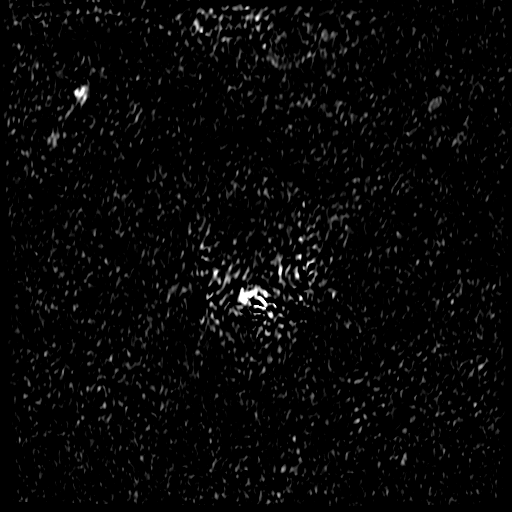

[((id)/(id)/1)-((id)/(id)/1) · axial · 3.0mm · 0.43mm/px · 1 of 76 slices shown (2 of 11)]
[im 1/76]
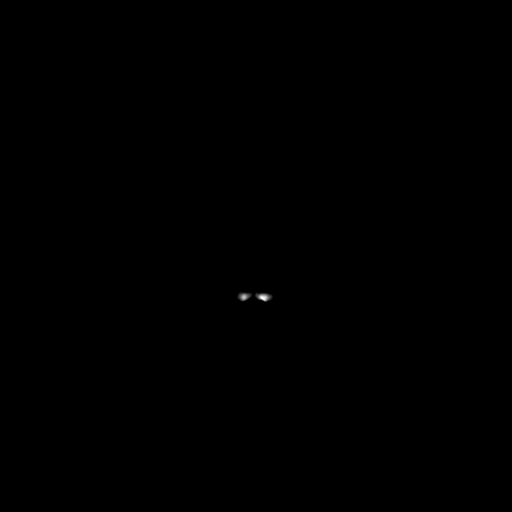

[((id)/(id)/1)-((id)/(id)/1) · axial · 3.0mm · 0.43mm/px · 1 of 76 slices shown (3 of 11)]
[im 1/76]
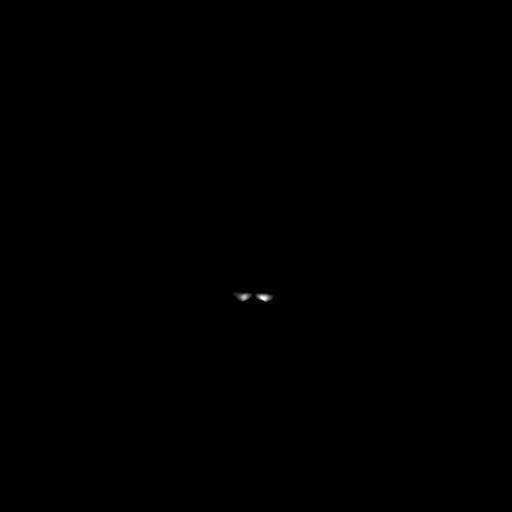

[((id)/(id)/1)-((id)/(id)/1) · axial · 3.0mm · 0.43mm/px · 1 of 76 slices shown (4 of 11)]
[im 1/76]
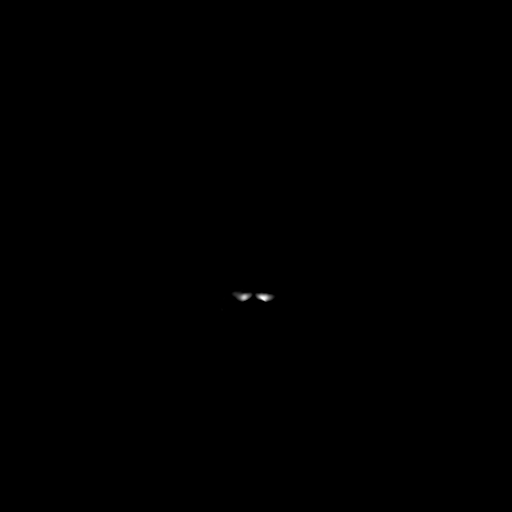

[((id)/(id)/1)-((id)/(id)/1) · axial · 3.0mm · 0.43mm/px · 1 of 76 slices shown (5 of 11)]
[im 1/76]
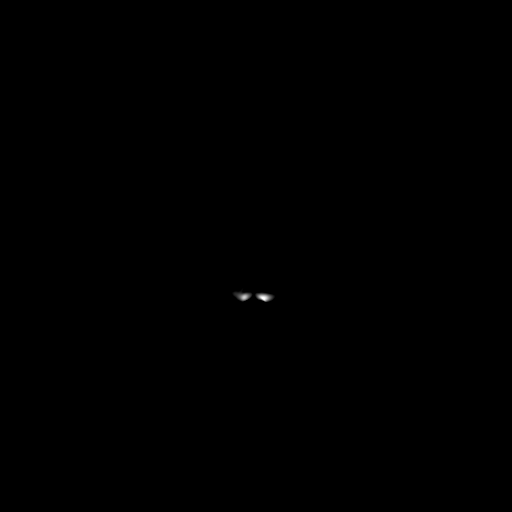

[((id)/(id)/1)-((id)/(id)/1) · axial · 3.0mm · 0.43mm/px · 1 of 76 slices shown (6 of 11)]
[im 1/76]
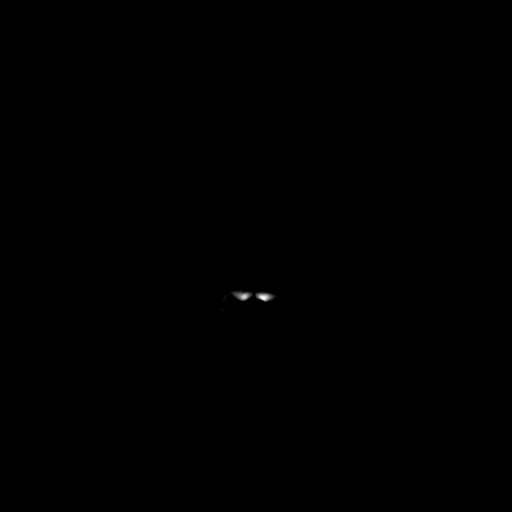

[((id)/(id)/1)-((id)/(id)/1) · axial · 3.0mm · 0.43mm/px · 1 of 73 slices shown (7 of 11)]
[im 1/73]
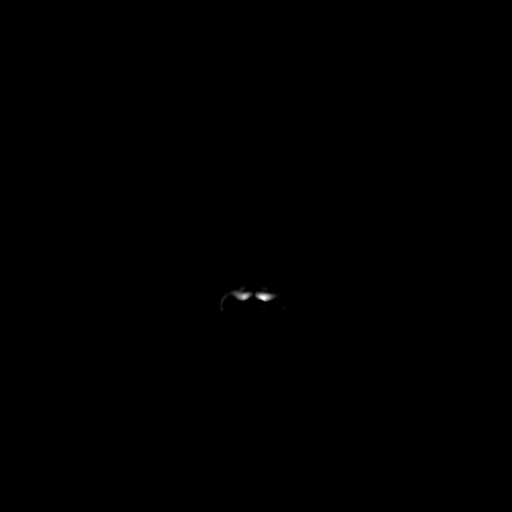

[((id)/(id)/1)-((id)/(id)/1) · axial · 3.0mm · 0.43mm/px · 1 of 76 slices shown (8 of 11)]
[im 1/76]
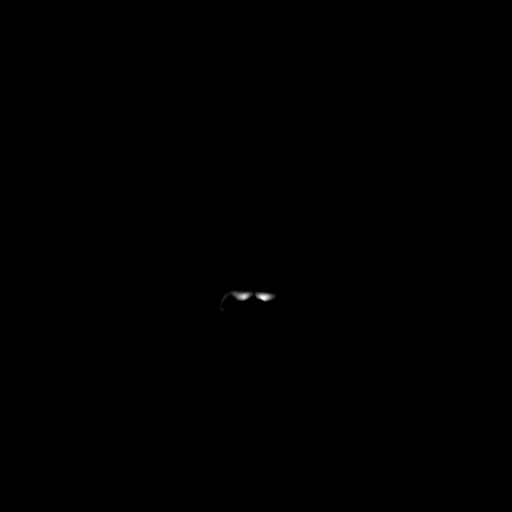

[((id)/(id)/1)-((id)/(id)/1) · axial · 3.0mm · 0.43mm/px · 1 of 76 slices shown (9 of 11)]
[im 1/76]
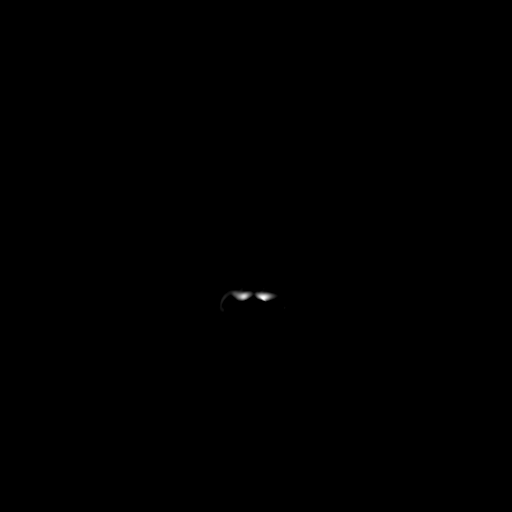

[((id)/(id)/1)-((id)/(id)/1) · axial · 3.0mm · 0.43mm/px · 1 of 76 slices shown (10 of 11)]
[im 1/76]
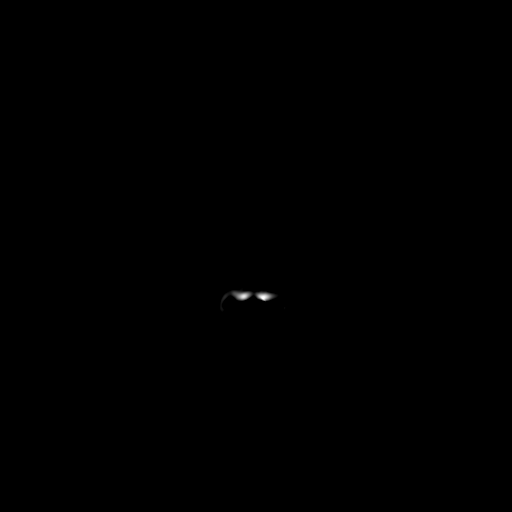

[((id)/(id)/1)-((id)/(id)/1) · axial · 3.0mm · 0.43mm/px · 1 of 76 slices shown (11 of 11)]
[im 1/76]
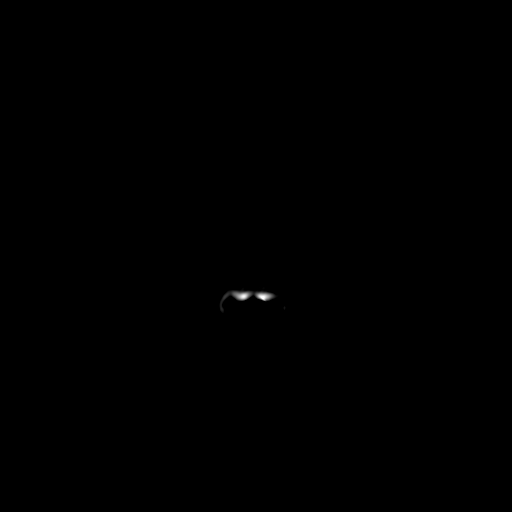

[23 of 53 positions shown; findings below may reference images not displayed]

FINDINGS: Prostate: Demonstrates moderate central gland enlargement and
heterogeneity, most consistent with benign prostatic hyperplasia.

Relatively diffuse, heterogeneous T2 hypointensity throughout the
peripheral zone. this is most apparent at the base, including on
image 12 of series 5. Within this underlying diffuse T2 hypointense
morphology, a more focal area of masslike T2 hypo intensity within
the left mid gland (images 14 and 15 of series 5) with extension
into the left apex (image 17 of series 5. Restricted diffusion
identified within the left mid to apical gland, including on images
14-12 series 6222 and images 15-11 of series 4477. Subtle early
post-contrast enhancement identified on images 39 through 48 of
series 77050.

Transcapsular spread:  Absent

Seminal vesicle involvement: Absent

Neurovascular bundle involvement: Absent

Pelvic adenopathy: Absent

Bone metastasis: Absent

Other findings: Normal urinary bladder. No pelvic free fluid. Normal
bowel loops.
IMPRESSION: 1. Relatively diffuse T2 hypointensity throughout the peripheral
zone is nonspecific, possibly related to prior prostatitis.
2. More focal, masslike T2 hypointensity, restricted diffusion, and
early post-contrast enhancement within the left mid to apical gland.
This suggests relatively high volume, high-grade disease (likely
higher grade than the 5165 biopsy results). Recommend attention to
this area on repeat biopsy.
3. No convincing evidence of locally advanced disease.

## 2017-02-28 DIAGNOSIS — Z8546 Personal history of malignant neoplasm of prostate: Secondary | ICD-10-CM | POA: Diagnosis not present

## 2017-04-07 DIAGNOSIS — I2609 Other pulmonary embolism with acute cor pulmonale: Secondary | ICD-10-CM | POA: Diagnosis not present

## 2017-04-07 DIAGNOSIS — I82443 Acute embolism and thrombosis of tibial vein, bilateral: Secondary | ICD-10-CM | POA: Diagnosis not present

## 2017-04-07 DIAGNOSIS — I10 Essential (primary) hypertension: Secondary | ICD-10-CM | POA: Diagnosis not present

## 2017-04-07 DIAGNOSIS — I519 Heart disease, unspecified: Secondary | ICD-10-CM | POA: Diagnosis not present

## 2017-04-07 DIAGNOSIS — R0600 Dyspnea, unspecified: Secondary | ICD-10-CM | POA: Diagnosis not present

## 2017-04-07 DIAGNOSIS — I509 Heart failure, unspecified: Secondary | ICD-10-CM | POA: Diagnosis not present

## 2017-04-07 DIAGNOSIS — N179 Acute kidney failure, unspecified: Secondary | ICD-10-CM | POA: Diagnosis present

## 2017-04-07 DIAGNOSIS — I824Z2 Acute embolism and thrombosis of unspecified deep veins of left distal lower extremity: Secondary | ICD-10-CM | POA: Diagnosis not present

## 2017-04-07 DIAGNOSIS — I2699 Other pulmonary embolism without acute cor pulmonale: Secondary | ICD-10-CM | POA: Diagnosis not present

## 2017-04-07 DIAGNOSIS — E876 Hypokalemia: Secondary | ICD-10-CM | POA: Diagnosis not present

## 2017-04-07 DIAGNOSIS — R0602 Shortness of breath: Secondary | ICD-10-CM | POA: Diagnosis not present

## 2017-04-07 DIAGNOSIS — I82402 Acute embolism and thrombosis of unspecified deep veins of left lower extremity: Secondary | ICD-10-CM | POA: Diagnosis not present

## 2017-04-07 DIAGNOSIS — I82442 Acute embolism and thrombosis of left tibial vein: Secondary | ICD-10-CM | POA: Diagnosis present

## 2017-04-07 DIAGNOSIS — Z8546 Personal history of malignant neoplasm of prostate: Secondary | ICD-10-CM | POA: Diagnosis not present

## 2017-04-07 DIAGNOSIS — E7800 Pure hypercholesterolemia, unspecified: Secondary | ICD-10-CM | POA: Diagnosis not present

## 2017-04-07 DIAGNOSIS — I2690 Septic pulmonary embolism without acute cor pulmonale: Secondary | ICD-10-CM | POA: Diagnosis not present

## 2017-04-07 DIAGNOSIS — Z9079 Acquired absence of other genital organ(s): Secondary | ICD-10-CM | POA: Diagnosis not present

## 2017-04-07 DIAGNOSIS — I2692 Saddle embolus of pulmonary artery without acute cor pulmonale: Secondary | ICD-10-CM | POA: Diagnosis not present

## 2017-04-07 DIAGNOSIS — I82432 Acute embolism and thrombosis of left popliteal vein: Secondary | ICD-10-CM | POA: Diagnosis present

## 2017-04-07 DIAGNOSIS — I2602 Saddle embolus of pulmonary artery with acute cor pulmonale: Secondary | ICD-10-CM | POA: Diagnosis not present

## 2017-04-07 DIAGNOSIS — Z23 Encounter for immunization: Secondary | ICD-10-CM | POA: Diagnosis not present

## 2017-04-07 DIAGNOSIS — Z79899 Other long term (current) drug therapy: Secondary | ICD-10-CM | POA: Diagnosis not present

## 2017-04-08 DIAGNOSIS — I824Z2 Acute embolism and thrombosis of unspecified deep veins of left distal lower extremity: Secondary | ICD-10-CM | POA: Insufficient documentation

## 2017-04-29 ENCOUNTER — Ambulatory Visit (INDEPENDENT_AMBULATORY_CARE_PROVIDER_SITE_OTHER): Payer: Medicare Other | Admitting: Pulmonary Disease

## 2017-04-29 ENCOUNTER — Encounter: Payer: Self-pay | Admitting: Pulmonary Disease

## 2017-04-29 VITALS — BP 124/70 | HR 65 | Ht 69.0 in | Wt 188.2 lb

## 2017-04-29 DIAGNOSIS — I2692 Saddle embolus of pulmonary artery without acute cor pulmonale: Secondary | ICD-10-CM

## 2017-04-29 DIAGNOSIS — I272 Pulmonary hypertension, unspecified: Secondary | ICD-10-CM

## 2017-04-29 MED ORDER — APIXABAN 5 MG PO TABS: 5.0000 mg | ORAL_TABLET | Freq: Two times a day (BID) | ORAL | 0 refills | Status: DC

## 2017-04-29 NOTE — Patient Instructions (Signed)
Pulmonary embolism: As this was an unprovoked pulmonary embolism I recommend lifelong anticoagulation Continue Eliquis 5 mg twice a day through March 2019 In March 2019 we will consider lowering the dose of Eliquis  Pulmonary hypertension secondary to acute pulmonary embolism. This should go away We will check an echocardiogram in March 2019 to evaluate further  We will see you back in March 2019

## 2017-04-29 NOTE — Progress Notes (Signed)
Subjective:    Patient ID: Anthony Miles, male    DOB: [birth year], 71 y.o.   MRN: [id number]  Synopsis: Unprovoked pulmonary embolism in September 2018.  Had evidence of RV overload on echocardiogram.  HPI Chief Complaint  Patient presents with  . Pulmonary Consult    treated for blood clot, denies SOB, feeling ok   Anthony Miles was recently diagnosed with a blood clot.  He says that for 6 weeks he had dypsnea that seemed to come and go.  He noticed that the dyspnea progressively worsened and was associated with chest tightness.  He went to the ER and was told he had a blood clot, apparently he had a saddle embolism.  He was started on Heparin and was sent to [facility] in [place] and was monitored.  He never needed thromoblysis.  His vital signs were normal while there.  He was discharged on eliquis.    He thought he had pneumonia.    He was told to follow up with his PCP and pulmonary for further evaluation.  He says that he has not had any recent surgery, leg injury or travel.  No swelling, no tenderness in his legs, though he was found to have a R leg DVT.  His prostate cancer is under control right now.  He had an aunt who died of a blood clot which was provoked.    He feels that his dyspnea has improved and he is back to exercising some, he played golf yesterday and walked for 2 hours.    Past Medical History:  Diagnosis Date  . Hyperlipidemia   . Hypertension   . Prostate cancer ([place]) 2016     Family History  Problem Relation Age of Onset  . Breast cancer Mother   . Colon cancer Neg Hx   . Esophageal cancer Neg Hx   . Rectal cancer Neg Hx   . Stomach cancer Neg Hx      Social History   Social History  . Marital status: Married    Spouse name: N/A  . Number of children: N/A  . Years of education: N/A   Occupational History  . Not on file.   Social History Main Topics  . Smoking status: Never Smoker  . Smokeless tobacco: Never Used  . Alcohol use 3.0 oz/week    6 Standard drinks or equivalent per week     Comment: occassionally  . Drug use: No  . Sexual activity: Not on file   Other Topics Concern  . Not on file   Social History Narrative  . No narrative on file     No Known Allergies   Outpatient Medications Prior to Visit  Medication Sig Dispense Refill  . hydrochlorothiazide (HYDRODIURIL) 12.5 MG tablet Take 25 mg by mouth every morning.     Marland Kitchen ibuprofen (ADVIL,MOTRIN) 200 MG tablet Take 200 mg by mouth as needed.    Marland Kitchen lisinopril (PRINIVIL,ZESTRIL) 10 MG tablet Take 10 mg by mouth at bedtime.     . Multiple Vitamin (MULTIVITAMIN) tablet Take 1 tablet by mouth daily.    . rosuvastatin (CRESTOR) 5 MG tablet Take 10 mg by mouth at bedtime.     Marland Kitchen zolpidem (AMBIEN) 10 MG tablet     . HYDROcodone-acetaminophen (NORCO) 5-325 MG tablet Take 1-2 tablets by mouth every 6 (six) hours as needed. 30 tablet 0  . sulfamethoxazole-trimethoprim (BACTRIM DS,SEPTRA DS) 800-160 MG tablet Take 1 tablet by mouth 2 (two) times daily. Start the day prior  to foley removal appointment 6 tablet 0   Facility-Administered Medications Prior to Visit  Medication Dose Route Frequency Provider Last Rate Last Dose  . 0.9 %  sodium chloride infusion  500 mL Intravenous Continuous Irene Shipper, MD          Review of Systems  Constitutional: Negative for fever and unexpected weight change.  HENT: Negative for congestion, dental problem, ear pain, nosebleeds, postnasal drip, rhinorrhea, sinus pressure, sneezing, sore throat and trouble swallowing.   Eyes: Negative for redness and itching.  Respiratory: Positive for shortness of breath. Negative for cough, chest tightness and wheezing.   Cardiovascular: Negative for palpitations and leg swelling.  Gastrointestinal: Negative for nausea and vomiting.  Genitourinary: Negative for dysuria.  Musculoskeletal: Negative for joint swelling.  Skin: Negative for rash.  Neurological: Negative for headaches.  Hematological:  Does not bruise/bleed easily.  Psychiatric/Behavioral: Negative for dysphoric mood. The patient is not nervous/anxious.        Objective:   Physical Exam  Vitals:   04/29/17 1012  BP: 124/70  Pulse: 65  SpO2: 95%  Weight: 188 lb 3.2 oz (85.4 kg)  Height: 5\' 9"  (1.753 m)   RA  Gen: well appearing, no acute distress HENT: NCAT, OP clear, neck supple without masses Eyes: PERRL, EOMi Lymph: no cervical lymphadenopathy PULM: CTA B CV: RRR, no mgr, no JVD GI: BS+, soft, nontender, no hsm Derm: no rash or skin breakdown MSK: normal bulk and tone Neuro: A&Ox4, CN II-XII intact, strength 5/5 in all 4 extremities Psyche: normal mood and affect   CBC    Component Value Date/Time   WBC 7.1 04/07/2015 1020   RBC 4.96 04/07/2015 1020   HGB 12.2 (L) 04/11/2015 0609   HCT 36.2 (L) 04/11/2015 0609   PLT 247 04/07/2015 1020   MCV 95.0 04/07/2015 1020   MCH 31.7 04/07/2015 1020   MCHC 33.3 04/07/2015 1020   RDW 13.2 04/07/2015 1020    BMET    Component Value Date/Time   NA 138 04/07/2015 1020   K 4.3 04/07/2015 1020   CL 105 04/07/2015 1020   CO2 25 04/07/2015 1020   GLUCOSE 101 (H) 04/07/2015 1020   BUN 22 (H) 04/07/2015 1020   CREATININE 1.27 (H) 04/07/2015 1020   CALCIUM 9.2 04/07/2015 1020   GFRNONAA 57 (L) 04/07/2015 1020   GFRAA >60 04/07/2015 1020   Records reviewed, hospitalized in September 2018 for an unprovoked pulmonary embolism.  Echocardiogram showed evidence of mild to moderate RV dysfunction.  He was found to have a left leg DVT.  He was discharged on Eliquis.     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)  Discussion: Anthony Miles is here today to establish care for me for an unprovoked pulmonary embolism.  We talked about the fact that after his 6 months of treatment with Eliquis lifetime risk of developing a recurrent pulmonary embolism is about 1 and  5.  Right now he seems to have no side effects from Eliquis though we did talk about this extensively and discussed the bleeding risk.  I think Eliquis is a fine choice for anticoagulation for him at this time.  He did have evidence of hypertension in the setting of the acute pulmonary embolism.  This should resolve over the next several weeks.  We will plan on repeating an echocardiogram in March.  In March we will likely decrease the dose of  Eliquis by 50% this has been shown to continue to prevent recurrent pulmonary embolism but theoretically lowers already very low risk of life-threatening bleeding.  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 This should go away We will check an echocardiogram in March 2019 to evaluate further  We will see you back in March 2019    Current Outpatient Prescriptions:  .  apixaban (ELIQUIS) 5 MG TABS tablet, Take 5 mg by mouth 2 (two) times daily., Disp: , Rfl:  .  hydrochlorothiazide (HYDRODIURIL) 12.5 MG tablet, Take 25 mg by mouth every morning. , Disp: , Rfl:  .  ibuprofen (ADVIL,MOTRIN) 200 MG tablet, Take 200 mg by mouth as needed., Disp: , Rfl:  .  lisinopril (PRINIVIL,ZESTRIL) 10 MG tablet, Take 10 mg by mouth at bedtime. , Disp: , Rfl:  .  Multiple Vitamin (MULTIVITAMIN) tablet, Take 1 tablet by mouth daily., Disp: , Rfl:  .  rosuvastatin (CRESTOR) 5 MG tablet, Take 10 mg by mouth at bedtime. , Disp: , Rfl:  .  zolpidem (AMBIEN) 10 MG tablet, , Disp: , Rfl:   Current Facility-Administered Medications:  .  0.9 %  sodium chloride infusion, 500 mL, Intravenous, Continuous, Irene Shipper, MD

## 2017-05-02 ENCOUNTER — Ambulatory Visit (HOSPITAL_COMMUNITY): Payer: Medicare Other

## 2017-05-02 ENCOUNTER — Telehealth: Payer: Self-pay | Admitting: Pulmonary Disease

## 2017-05-02 IMAGING — CR DG CHEST 2V
2 series · 2 of 2 positions shown · non-contrast
Comparison: None.

CLINICAL DATA: Prostate cancer.

EXAM:
CHEST  2 VIEW

[w chest pa]
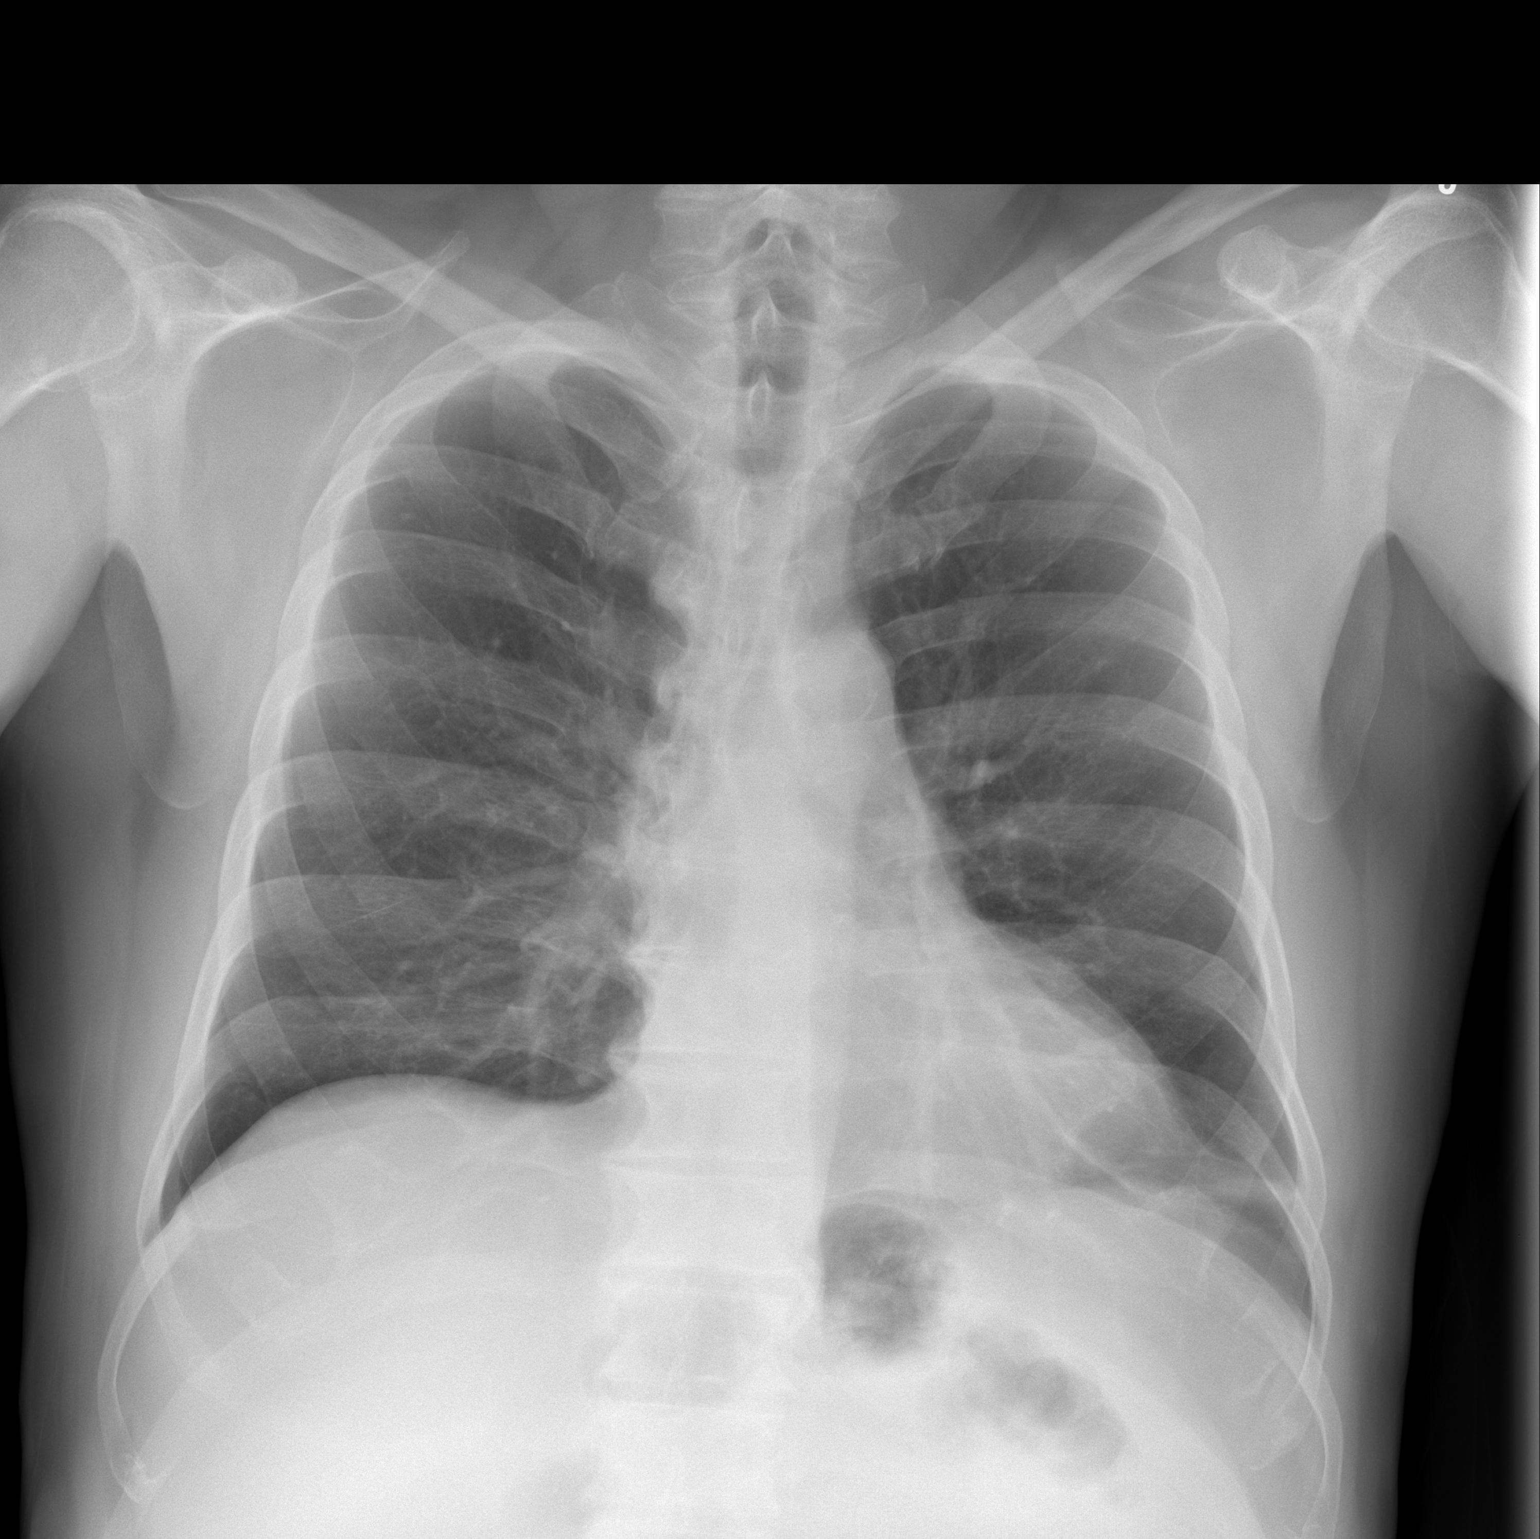

[w chest lat]
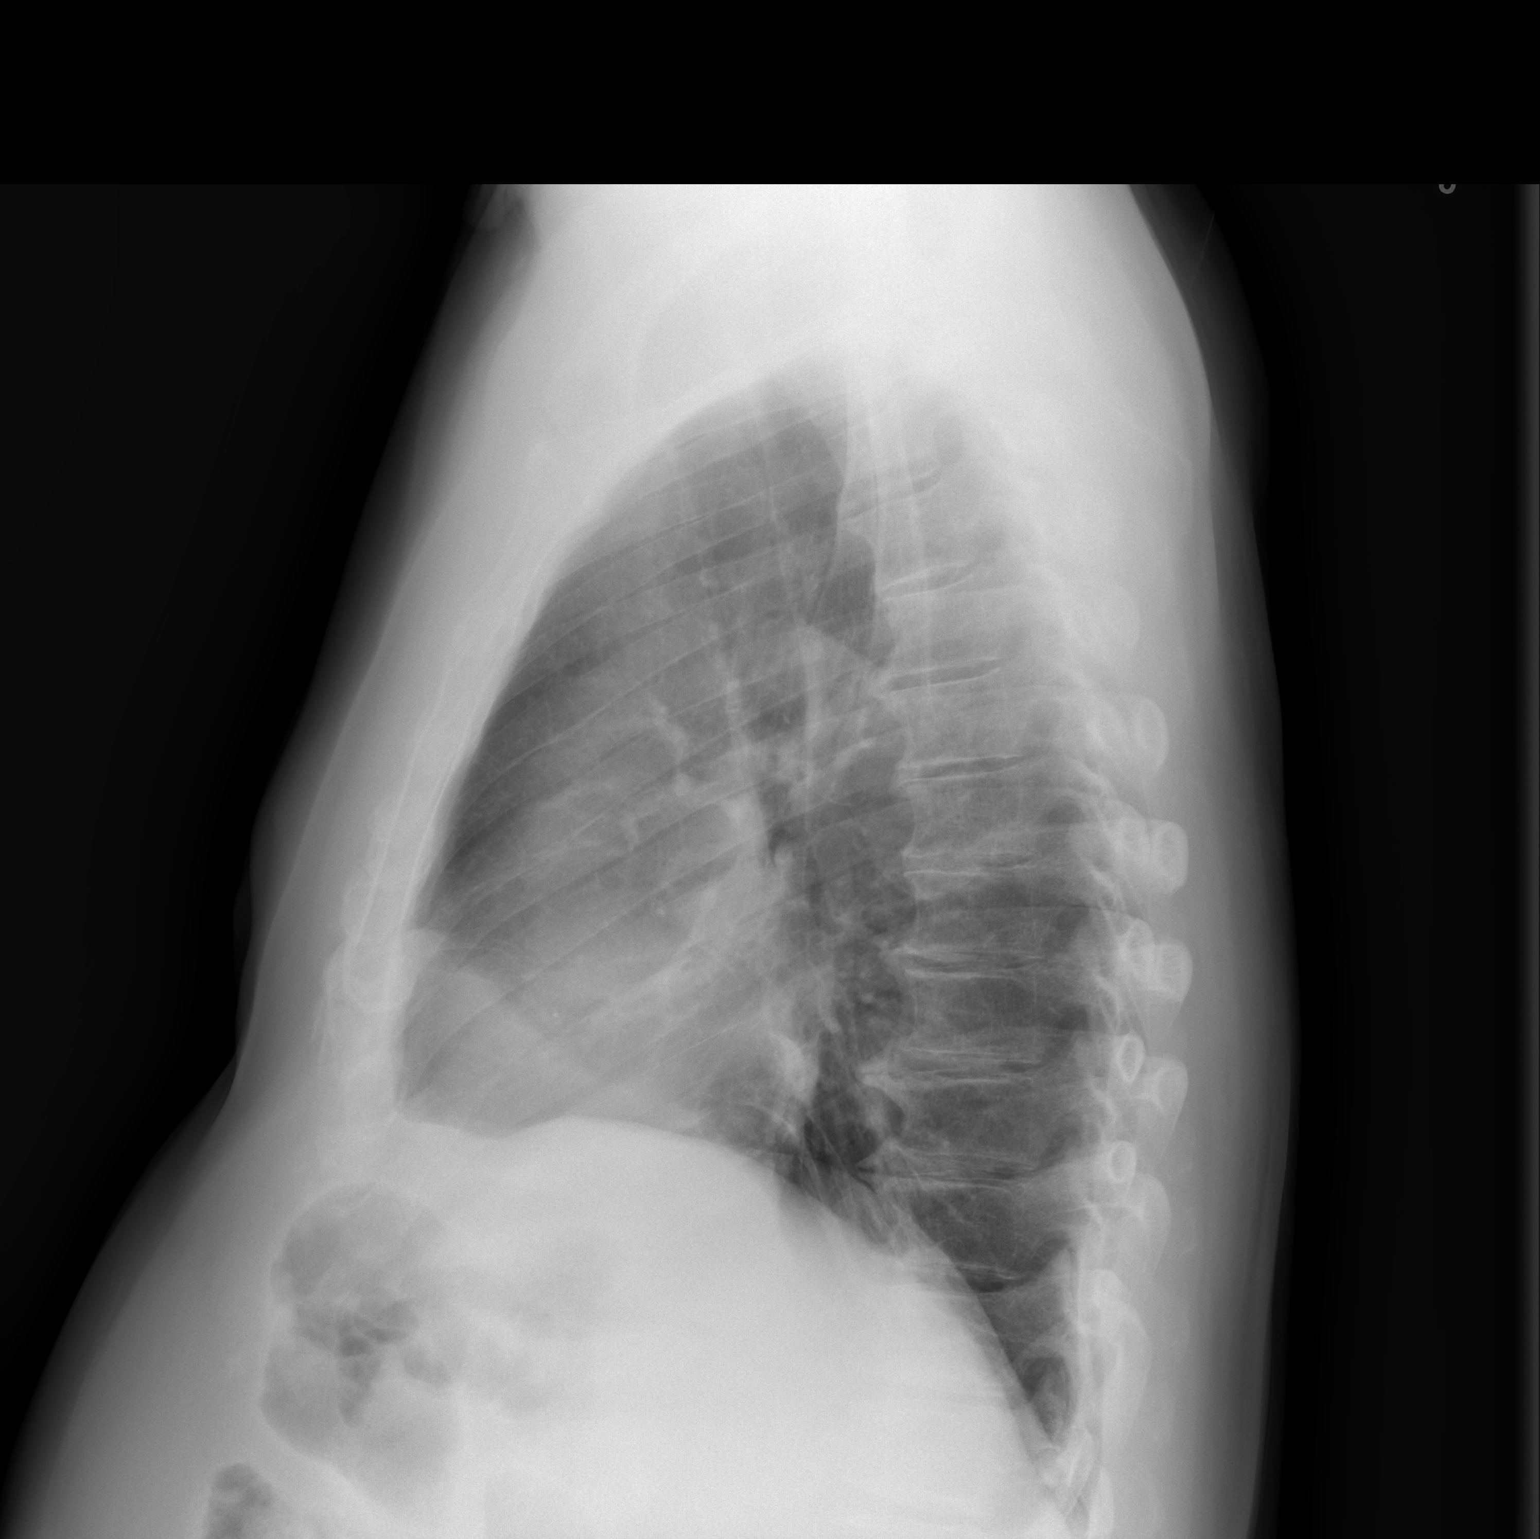

[2 of 2 positions shown; findings below may reference images not displayed]

FINDINGS: Mediastinum and hilar structures normal. Left base subsegmental
atelectasis and/or scarring noted. No pleural effusion or
pneumothorax. Heart size normal. No acute bony abnormality .
IMPRESSION: Left base subsegmental atelectasis and/or scarring .

## 2017-05-02 NOTE — Telephone Encounter (Signed)
Spoke with pt's wife. She is stating they went for an echo and was not supposed to be getting this until March 2019. She states they had to drive all the way to [place] for this and wanted to let us know that this happened and how inconvenient this was. I agreed with her and expressed my sincere apology for this mistake. Nothing further is needed.

## 2017-05-05 ENCOUNTER — Other Ambulatory Visit (HOSPITAL_COMMUNITY): Payer: Medicare Other

## 2017-05-09 DIAGNOSIS — I2699 Other pulmonary embolism without acute cor pulmonale: Secondary | ICD-10-CM | POA: Diagnosis not present

## 2017-05-09 DIAGNOSIS — I82402 Acute embolism and thrombosis of unspecified deep veins of left lower extremity: Secondary | ICD-10-CM | POA: Diagnosis not present

## 2017-05-09 DIAGNOSIS — Z6827 Body mass index (BMI) 27.0-27.9, adult: Secondary | ICD-10-CM | POA: Diagnosis not present

## 2017-08-08 DIAGNOSIS — D1801 Hemangioma of skin and subcutaneous tissue: Secondary | ICD-10-CM | POA: Diagnosis not present

## 2017-08-08 DIAGNOSIS — Z85828 Personal history of other malignant neoplasm of skin: Secondary | ICD-10-CM | POA: Diagnosis not present

## 2017-08-08 DIAGNOSIS — L821 Other seborrheic keratosis: Secondary | ICD-10-CM | POA: Diagnosis not present

## 2017-08-08 DIAGNOSIS — L570 Actinic keratosis: Secondary | ICD-10-CM | POA: Diagnosis not present

## 2017-08-23 DIAGNOSIS — Z8546 Personal history of malignant neoplasm of prostate: Secondary | ICD-10-CM | POA: Diagnosis not present

## 2017-08-23 DIAGNOSIS — R7301 Impaired fasting glucose: Secondary | ICD-10-CM | POA: Diagnosis not present

## 2017-08-23 DIAGNOSIS — I10 Essential (primary) hypertension: Secondary | ICD-10-CM | POA: Diagnosis not present

## 2017-08-23 DIAGNOSIS — E785 Hyperlipidemia, unspecified: Secondary | ICD-10-CM | POA: Diagnosis not present

## 2017-08-23 DIAGNOSIS — Z0000 Encounter for general adult medical examination without abnormal findings: Secondary | ICD-10-CM | POA: Diagnosis not present

## 2017-08-29 DIAGNOSIS — Z8546 Personal history of malignant neoplasm of prostate: Secondary | ICD-10-CM | POA: Diagnosis not present

## 2017-08-29 DIAGNOSIS — E7849 Other hyperlipidemia: Secondary | ICD-10-CM | POA: Diagnosis not present

## 2017-08-29 DIAGNOSIS — I10 Essential (primary) hypertension: Secondary | ICD-10-CM | POA: Diagnosis not present

## 2017-08-29 DIAGNOSIS — Z6828 Body mass index (BMI) 28.0-28.9, adult: Secondary | ICD-10-CM | POA: Diagnosis not present

## 2017-08-29 DIAGNOSIS — Z86718 Personal history of other venous thrombosis and embolism: Secondary | ICD-10-CM | POA: Diagnosis not present

## 2017-08-29 DIAGNOSIS — Z86711 Personal history of pulmonary embolism: Secondary | ICD-10-CM | POA: Diagnosis not present

## 2017-08-29 DIAGNOSIS — G4700 Insomnia, unspecified: Secondary | ICD-10-CM | POA: Diagnosis not present

## 2017-08-29 DIAGNOSIS — Z1389 Encounter for screening for other disorder: Secondary | ICD-10-CM | POA: Diagnosis not present

## 2017-08-29 DIAGNOSIS — Z86010 Personal history of colonic polyps: Secondary | ICD-10-CM | POA: Diagnosis not present

## 2017-08-29 DIAGNOSIS — I272 Pulmonary hypertension, unspecified: Secondary | ICD-10-CM | POA: Diagnosis not present

## 2017-08-29 DIAGNOSIS — Z0000 Encounter for general adult medical examination without abnormal findings: Secondary | ICD-10-CM | POA: Diagnosis not present

## 2017-08-29 DIAGNOSIS — R7301 Impaired fasting glucose: Secondary | ICD-10-CM | POA: Diagnosis not present

## 2017-09-12 DIAGNOSIS — H04123 Dry eye syndrome of bilateral lacrimal glands: Secondary | ICD-10-CM | POA: Diagnosis not present

## 2017-09-12 DIAGNOSIS — Z961 Presence of intraocular lens: Secondary | ICD-10-CM | POA: Diagnosis not present

## 2017-09-12 DIAGNOSIS — H3554 Dystrophies primarily involving the retinal pigment epithelium: Secondary | ICD-10-CM | POA: Diagnosis not present

## 2017-09-12 DIAGNOSIS — H401121 Primary open-angle glaucoma, left eye, mild stage: Secondary | ICD-10-CM | POA: Diagnosis not present

## 2017-09-22 ENCOUNTER — Ambulatory Visit (HOSPITAL_COMMUNITY): Payer: Medicare Other | Attending: Cardiology

## 2017-09-22 ENCOUNTER — Other Ambulatory Visit: Payer: Self-pay

## 2017-09-22 DIAGNOSIS — R0600 Dyspnea, unspecified: Secondary | ICD-10-CM | POA: Insufficient documentation

## 2017-09-22 DIAGNOSIS — I272 Pulmonary hypertension, unspecified: Secondary | ICD-10-CM | POA: Diagnosis not present

## 2017-09-22 DIAGNOSIS — I351 Nonrheumatic aortic (valve) insufficiency: Secondary | ICD-10-CM | POA: Diagnosis not present

## 2017-09-22 DIAGNOSIS — E785 Hyperlipidemia, unspecified: Secondary | ICD-10-CM | POA: Insufficient documentation

## 2017-09-22 DIAGNOSIS — I119 Hypertensive heart disease without heart failure: Secondary | ICD-10-CM | POA: Insufficient documentation

## 2017-09-22 DIAGNOSIS — Z86711 Personal history of pulmonary embolism: Secondary | ICD-10-CM | POA: Diagnosis not present

## 2017-09-28 ENCOUNTER — Encounter: Payer: Self-pay | Admitting: Pulmonary Disease

## 2017-09-28 ENCOUNTER — Ambulatory Visit (INDEPENDENT_AMBULATORY_CARE_PROVIDER_SITE_OTHER): Payer: Medicare Other | Admitting: Pulmonary Disease

## 2017-09-28 VITALS — BP 108/62 | HR 78 | Ht 69.0 in | Wt 175.0 lb

## 2017-09-28 DIAGNOSIS — I272 Pulmonary hypertension, unspecified: Secondary | ICD-10-CM | POA: Diagnosis not present

## 2017-09-28 MED ORDER — APIXABAN 2.5 MG PO TABS: 2.5000 mg | ORAL_TABLET | Freq: Two times a day (BID) | ORAL | 5 refills | Status: DC

## 2017-09-28 NOTE — Progress Notes (Signed)
Subjective:    Patient ID: Anthony Miles, male    DOB: 1946/06/14, 72 y.o.   MRN: [id number]  Synopsis: Unprovoked pulmonary embolism in September 2018.  Had evidence of RV overload on echocardiogram.  HPI Chief Complaint  Patient presents with  . Follow-up    On echo 09/22/17   Anthony Miles has been doing well since last visit.  No recent problems with shortness of breath or bleeding.  He does note some joint pain.  He stopped taking ibuprofen because he is worried about his bleeding risk with this medicine.  He is here to follow-up on his echocardiogram.  Past Medical History:  Diagnosis Date  . Hyperlipidemia   . Hypertension   . Prostate cancer ([place]) 2016     Family History  Problem Relation Age of Onset  . Breast cancer Mother   . Colon cancer Neg Hx   . Esophageal cancer Neg Hx   . Rectal cancer Neg Hx   . Stomach cancer Neg Hx        Review of Systems  Constitutional: Negative for fever and unexpected weight change.  HENT: Negative for congestion, dental problem, ear pain, nosebleeds, postnasal drip, rhinorrhea, sinus pressure, sneezing, sore throat and trouble swallowing.   Eyes: Negative for redness and itching.  Respiratory: Positive for shortness of breath. Negative for cough, chest tightness and wheezing.   Cardiovascular: Negative for palpitations and leg swelling.  Gastrointestinal: Negative for nausea and vomiting.  Genitourinary: Negative for dysuria.  Musculoskeletal: Negative for joint swelling.  Skin: Negative for rash.  Neurological: Negative for headaches.  Hematological: Does not bruise/bleed easily.  Psychiatric/Behavioral: Negative for dysphoric mood. The patient is not nervous/anxious.        Objective:   Physical Exam  Vitals:   09/28/17 0932 09/28/17 0933  BP:  108/62  Pulse:  78  SpO2:  96%  Weight: 175 lb (79.4 kg)   Height: 5\' 9"  (1.753 m)    RA  Gen: well appearing HENT: OP clear, TM's clear, neck supple PULM: CTA B, normal  percussion CV: RRR, no mgr, trace edema GI: BS+, soft, nontender Derm: no cyanosis or rash Psyche: normal mood and affect    CBC    Component Value Date/Time   WBC 7.1 04/07/2015 1020   RBC 4.96 04/07/2015 1020   HGB 12.2 (L) 04/11/2015 0609   HCT 36.2 (L) 04/11/2015 0609   PLT 247 04/07/2015 1020   MCV 95.0 04/07/2015 1020   MCH 31.7 04/07/2015 1020   MCHC 33.3 04/07/2015 1020   RDW 13.2 04/07/2015 1020    BMET    Component Value Date/Time   NA 138 04/07/2015 1020   K 4.3 04/07/2015 1020   CL 105 04/07/2015 1020   CO2 25 04/07/2015 1020   GLUCOSE 101 (H) 04/07/2015 1020   BUN 22 (H) 04/07/2015 1020   CREATININE 1.27 (H) 04/07/2015 1020   CALCIUM 9.2 04/07/2015 1020   GFRNONAA 57 (L) 04/07/2015 1020   GFRAA >60 04/07/2015 1020   Records reviewed, hospitalized in September 2018 for an unprovoked pulmonary embolism.  Echocardiogram showed evidence of mild to moderate RV dysfunction.  He was found to have a left leg DVT.  He was discharged on Eliquis.  Echocardiogram: March 2019 LVEF normal, RV size and function normal, PA pressure estimate normal.     Assessment & Plan:   Pulmonary hypertension ([place])  Discussion: This has been a stable interval for Anthony Miles.  He has not had an episode of  bleeding or recurrent blood clot.  His echocardiogram today showed no evidence of persistent pulmonary hypertension which is what we see in 95% or greater of patients who have had a blood clot.  At this point it is safe for [place] to lower the dose of Eliquis to 2.5 mg twice a day.  He should take this indefinitely because he had an unprovoked blood clot.  From my standpoint it would be okay for him to take ibuprofen as needed for arthritis if only taking it 2-3 times a week.  He knows to stop taking it if he has an upset stomach.  Plan: Unprovoked pulmonary embolism: We will decrease the dose of Eliquis to 2.5 mg twice a day, you will take this indefinitely If you have any sort of  bleeding let us know right away If you have problems or shortness of breath or chest tightness please let us know right away  We will plan on seeing you back in 1 year or sooner if needed    Current Outpatient Medications:  .  hydrochlorothiazide (HYDRODIURIL) 12.5 MG tablet, Take 25 mg by mouth every morning. , Disp: , Rfl:  .  lisinopril (PRINIVIL,ZESTRIL) 10 MG tablet, Take 10 mg by mouth at bedtime. , Disp: , Rfl:  .  Multiple Vitamin (MULTIVITAMIN) tablet, Take 1 tablet by mouth daily., Disp: , Rfl:  .  rosuvastatin (CRESTOR) 5 MG tablet, Take 10 mg by mouth at bedtime. , Disp: , Rfl:  .  zolpidem (AMBIEN) 10 MG tablet, , Disp: , Rfl:  .  apixaban (ELIQUIS) 2.5 MG TABS tablet, Take 1 tablet (2.5 mg total) by mouth 2 (two) times daily., Disp: 60 tablet, Rfl: 5  Current Facility-Administered Medications:  .  0.9 %  sodium chloride infusion, 500 mL, Intravenous, Continuous, Irene Shipper, MD

## 2017-09-28 NOTE — Patient Instructions (Signed)
Unprovoked pulmonary embolism: We will decrease the dose of Eliquis to 2.5 mg twice a day, you will take this indefinitely If you have any sort of bleeding let us know right away If you have problems or shortness of breath or chest tightness please let us know right away  We will plan on seeing you back in 1 year or sooner if needed

## 2017-10-25 DIAGNOSIS — H401121 Primary open-angle glaucoma, left eye, mild stage: Secondary | ICD-10-CM | POA: Diagnosis not present

## 2017-10-25 DIAGNOSIS — H3554 Dystrophies primarily involving the retinal pigment epithelium: Secondary | ICD-10-CM | POA: Diagnosis not present

## 2018-02-09 DIAGNOSIS — Z8546 Personal history of malignant neoplasm of prostate: Secondary | ICD-10-CM | POA: Diagnosis not present

## 2018-03-23 DIAGNOSIS — Z23 Encounter for immunization: Secondary | ICD-10-CM | POA: Diagnosis not present

## 2018-05-29 DIAGNOSIS — H401121 Primary open-angle glaucoma, left eye, mild stage: Secondary | ICD-10-CM | POA: Diagnosis not present

## 2018-05-29 DIAGNOSIS — H3554 Dystrophies primarily involving the retinal pigment epithelium: Secondary | ICD-10-CM | POA: Diagnosis not present

## 2018-09-15 DIAGNOSIS — D485 Neoplasm of uncertain behavior of skin: Secondary | ICD-10-CM | POA: Diagnosis not present

## 2018-09-15 DIAGNOSIS — D1801 Hemangioma of skin and subcutaneous tissue: Secondary | ICD-10-CM | POA: Diagnosis not present

## 2018-09-15 DIAGNOSIS — I10 Essential (primary) hypertension: Secondary | ICD-10-CM | POA: Diagnosis not present

## 2018-09-15 DIAGNOSIS — D0461 Carcinoma in situ of skin of right upper limb, including shoulder: Secondary | ICD-10-CM | POA: Diagnosis not present

## 2018-09-15 DIAGNOSIS — R82998 Other abnormal findings in urine: Secondary | ICD-10-CM | POA: Diagnosis not present

## 2018-09-15 DIAGNOSIS — E7849 Other hyperlipidemia: Secondary | ICD-10-CM | POA: Diagnosis not present

## 2018-09-15 DIAGNOSIS — R7301 Impaired fasting glucose: Secondary | ICD-10-CM | POA: Diagnosis not present

## 2018-09-15 DIAGNOSIS — L812 Freckles: Secondary | ICD-10-CM | POA: Diagnosis not present

## 2018-09-15 DIAGNOSIS — Z125 Encounter for screening for malignant neoplasm of prostate: Secondary | ICD-10-CM | POA: Diagnosis not present

## 2018-09-15 DIAGNOSIS — L821 Other seborrheic keratosis: Secondary | ICD-10-CM | POA: Diagnosis not present

## 2018-09-15 DIAGNOSIS — L570 Actinic keratosis: Secondary | ICD-10-CM | POA: Diagnosis not present

## 2018-09-15 DIAGNOSIS — Z85828 Personal history of other malignant neoplasm of skin: Secondary | ICD-10-CM | POA: Diagnosis not present

## 2018-09-15 DIAGNOSIS — D1721 Benign lipomatous neoplasm of skin and subcutaneous tissue of right arm: Secondary | ICD-10-CM | POA: Diagnosis not present

## 2018-09-25 ENCOUNTER — Ambulatory Visit: Payer: Medicare Other | Admitting: Pulmonary Disease

## 2018-09-25 DIAGNOSIS — I10 Essential (primary) hypertension: Secondary | ICD-10-CM | POA: Diagnosis not present

## 2018-09-25 DIAGNOSIS — R7301 Impaired fasting glucose: Secondary | ICD-10-CM | POA: Diagnosis not present

## 2018-09-25 DIAGNOSIS — Z0000 Encounter for general adult medical examination without abnormal findings: Secondary | ICD-10-CM | POA: Diagnosis not present

## 2018-09-25 DIAGNOSIS — Z86010 Personal history of colonic polyps: Secondary | ICD-10-CM | POA: Diagnosis not present

## 2018-09-25 DIAGNOSIS — Z86711 Personal history of pulmonary embolism: Secondary | ICD-10-CM | POA: Diagnosis not present

## 2018-09-25 DIAGNOSIS — Z8546 Personal history of malignant neoplasm of prostate: Secondary | ICD-10-CM | POA: Diagnosis not present

## 2018-09-25 DIAGNOSIS — E785 Hyperlipidemia, unspecified: Secondary | ICD-10-CM | POA: Diagnosis not present

## 2018-09-25 DIAGNOSIS — N529 Male erectile dysfunction, unspecified: Secondary | ICD-10-CM | POA: Diagnosis not present

## 2018-09-25 DIAGNOSIS — G4700 Insomnia, unspecified: Secondary | ICD-10-CM | POA: Diagnosis not present

## 2018-10-30 ENCOUNTER — Ambulatory Visit: Payer: Medicare Other | Admitting: Pulmonary Disease

## 2018-10-31 ENCOUNTER — Ambulatory Visit: Payer: Medicare Other | Admitting: Pulmonary Disease

## 2018-11-01 NOTE — Progress Notes (Signed)
Virtual Visit via Telephone Note  I connected with Anthony Miles on 11/02/18 at  9:00 AM EDT by telephone and verified that I am speaking with the correct person using two identifiers.   I discussed the limitations, risks, security and privacy concerns of performing an evaluation and management service by telephone and the availability of in person appointments. I also discussed with the patient that there may be a patient responsible charge related to this service. The patient expressed understanding and agreed to proceed.   History of Present Illness: 73 year old male followed in our office for an unprovoked pulmonary embolism in 2018.  Patient is maintained on Eliquis. Patient of Dr. Lake Bells  Patient consented to consult via telephone:Yes  People present and their role in pt care: Pt  Chief complaint: 1 year follow-up for unprovoked PE  73 year old male followed in our office for pulmonary embolism (unprovoked in 2018) he is maintained on Eliquis.  He is a patient of Dr. Lake Bells.  He is completing a 1 year follow-up with our office.  Patient reports that he is doing fine has no current issues or complaints.  He was last seen in our office in 2019.  Also in 2019 he had an echocardiogram that showed normal EF, RV size, and PA pressures.  Patient reports that he walks daily and averages around 15 to 17 miles a week walking.  He plays golf multiple times a week.  He has no concerns regarding bruising, blood in stool or urine or coughing up blood.  Patient also has completed a recent telephonic yearly physical with his primary care provider.   Observations/Objective:  Chart Review:  hospitalized in September 2018 for an unprovoked pulmonary embolism.  Echocardiogram showed evidence of mild to moderate RV dysfunction.  He was found to have a left leg DVT.  He was discharged on Eliquis.  Echocardiogram: March 2019 LVEF normal, RV size and function normal, PA pressure estimate normal.  No  results found for: NITRICOXIDE    Assessment and Plan:  Pulmonary hypertension (HCC) A: 2018 moderate RV dysfunction in the setting of an unprovoked PE 2019 echo shows normal ejection fraction as well as RV size and function normal, normal PA pressure  P:  Continue to monitor clinically  Acute saddle pulmonary embolism without acute cor pulmonale (HCC) Assessment: September/2018 unprovoked PE March/2019 echocardiogram shows normal LV ejection fraction as well as RV size, PA pressures Maintained on Eliquis  Plan: Continue Eliquis 2.5 mg twice daily indefinitely Follow-up with our office in 1 year Notify our office if you have any increased shortness of breath, chest pain, lower extremity swelling Notify our office if you have any sort of increased signs of bleeding such as bruising, stomach pain, coughing up blood, blood in your urine or stool    Follow Up Instructions:  Return in about 1 year (around 11/02/2019), or if symptoms worsen or fail to improve, for Follow up with Dr. Lake Bells.    I discussed the assessment and treatment plan with the patient. The patient was provided an opportunity to ask questions and all were answered. The patient agreed with the plan and demonstrated an understanding of the instructions.   The patient was advised to call back or seek an in-person evaluation if the symptoms worsen or if the condition fails to improve as anticipated.  I provided 22 minutes of non-face-to-face time during this encounter.   Lauraine Rinne, NP

## 2018-11-02 ENCOUNTER — Ambulatory Visit (INDEPENDENT_AMBULATORY_CARE_PROVIDER_SITE_OTHER): Payer: Medicare Other | Admitting: Pulmonary Disease

## 2018-11-02 ENCOUNTER — Encounter: Payer: Self-pay | Admitting: Pulmonary Disease

## 2018-11-02 ENCOUNTER — Other Ambulatory Visit: Payer: Self-pay

## 2018-11-02 DIAGNOSIS — I2692 Saddle embolus of pulmonary artery without acute cor pulmonale: Secondary | ICD-10-CM | POA: Diagnosis not present

## 2018-11-02 DIAGNOSIS — I272 Pulmonary hypertension, unspecified: Secondary | ICD-10-CM | POA: Diagnosis not present

## 2018-11-02 NOTE — Patient Instructions (Addendum)
History of Pulmonary Embolism:   Continue to take Eliquis to 2.5 mg twice a day, you will take this indefinitely If you have any sort of bleeding let us know right away If you have problems or shortness of breath or chest tightness please let us know right away   Return in about 1 year (around 11/02/2019), or if symptoms worsen or fail to improve, for Follow up with Dr. Lake Bells.   Coronavirus (COVID-19) Are you at risk?  Are you at risk for the Coronavirus (COVID-19)?  To be considered HIGH RISK for Coronavirus (COVID-19), you have to meet the following criteria:  . Traveled to [place], [place], [place], [place] or [place]; or in the [place] to [place], [place], [place], or [place]; and have fever, cough, and shortness of breath within the last 2 weeks of travel OR . Been in close contact with a person diagnosed with COVID-19 within the last 2 weeks and have fever, cough, and shortness of breath . IF YOU DO NOT MEET THESE CRITERIA, YOU ARE CONSIDERED LOW RISK FOR COVID-19.  What to do if you are HIGH RISK for COVID-19?  Marland Kitchen If you are having a medical emergency, call 911. . Seek medical care right away. Before you go to a doctor's office, urgent care or emergency department, call ahead and tell them about your recent travel, contact with someone diagnosed with COVID-19, and your symptoms. You should receive instructions from your physician's office regarding next steps of care.  . When you arrive at healthcare provider, tell the healthcare staff immediately you have returned from visiting [place], [place], [place], [place] or [place]; or traveled in the [place] to [place], [place], [place], or [place]; in the last two weeks or you have been in close contact with a person diagnosed with COVID-19 in the last 2 weeks.   . Tell the health care staff about your symptoms: fever, cough and shortness of breath. . After you have been seen by a medical provider, you will be  either: o Tested for (COVID-19) and discharged home on quarantine except to seek medical care if symptoms worsen, and asked to  - Stay home and avoid contact with others until you get your results (4-5 days)  - Avoid travel on public transportation if possible (such as bus, train, or airplane) or o Sent to the Emergency Department by EMS for evaluation, COVID-19 testing, and possible admission depending on your condition and test results.  What to do if you are LOW RISK for COVID-19?  Reduce your risk of any infection by using the same precautions used for avoiding the common cold or flu:  Marland Kitchen Wash your hands often with soap and warm water for at least 20 seconds.  If soap and water are not readily available, use an alcohol-based hand sanitizer with at least 60% alcohol.  . If coughing or sneezing, cover your mouth and nose by coughing or sneezing into the elbow areas of your shirt or coat, into a tissue or into your sleeve (not your hands). . Avoid shaking hands with others and consider head nods or verbal greetings only. . Avoid touching your eyes, nose, or mouth with unwashed hands.  . Avoid close contact with people who are sick. . Avoid places or events with large numbers of people in one location, like concerts or sporting events. . Carefully consider travel plans you have or are making. . If you are planning any travel outside or inside the  [place], visit the CDC's Travelers' Health webpage for the latest health notices. . If you have some symptoms but not all symptoms, continue to monitor at home and seek medical attention if your symptoms worsen. . If you are having a medical emergency, call 911.   [place] / e-Visit: [email or web address]         [facility]: [facility]: [phone]                   [facility]: [phone]            It is flu season:   >>> Best ways to protect herself from the flu: Receive the yearly flu vaccine, practice good hand hygiene washing with soap and also using hand sanitizer when available, eat a nutritious meals, get adequate rest, hydrate appropriately   Please contact the office if your symptoms worsen or you have concerns that you are not improving.   Thank you for choosing [facility] for your healthcare, and for allowing [place] to partner with you on your healthcare journey. I am thankful to be able to provide care to you today.   Wyn Quaker FNP-C

## 2018-11-02 NOTE — Assessment & Plan Note (Signed)
Assessment: September/2018 unprovoked PE March/2019 echocardiogram shows normal LV ejection fraction as well as RV size, PA pressures Maintained on Eliquis  Plan: Continue Eliquis 2.5 mg twice daily indefinitely Follow-up with our office in 1 year Notify our office if you have any increased shortness of breath, chest pain, lower extremity swelling Notify our office if you have any sort of increased signs of bleeding such as bruising, stomach pain, coughing up blood, blood in your urine or stool

## 2018-11-02 NOTE — Progress Notes (Signed)
Reviewed, agree 

## 2018-11-02 NOTE — Assessment & Plan Note (Addendum)
A: 2018 moderate RV dysfunction in the setting of an unprovoked PE 2019 echo shows normal ejection fraction as well as RV size and function normal, normal PA pressure  P:  Continue to monitor clinically

## 2019-03-03 DIAGNOSIS — Z23 Encounter for immunization: Secondary | ICD-10-CM | POA: Diagnosis not present

## 2019-03-15 DIAGNOSIS — L570 Actinic keratosis: Secondary | ICD-10-CM | POA: Diagnosis not present

## 2019-03-15 DIAGNOSIS — Z85828 Personal history of other malignant neoplasm of skin: Secondary | ICD-10-CM | POA: Diagnosis not present

## 2019-03-26 ENCOUNTER — Ambulatory Visit: Payer: Self-pay | Admitting: Surgery

## 2019-03-26 DIAGNOSIS — K429 Umbilical hernia without obstruction or gangrene: Secondary | ICD-10-CM | POA: Diagnosis not present

## 2019-03-26 NOTE — H&P (Signed)
History of Present Illness Anthony Miles. Jahsiah Carpenter MD; 03/26/2019 10:18 AM) The patient is a 73 year old male who presents with an umbilical hernia. Referred by Dr. Dutch Gray for umbilical hernia  This is a 73 year old male who is status post robotic prostatectomy in 2016 for prostate cancer who presents with a two-year history of a slowly enlarging bulge at his umbilicus. This has become more uncomfortable. He denies any obstructive symptoms. He presents now to discuss hernia repair.  The patient had an unprovoked pulmonary embolus in 2018. He has recovered fully and is anticoagulated on Eliquis     Problem List/Past Medical Anthony Miles. Manly Nestle, MD; [id number] AM) UMBILICAL HERNIA WITHOUT OBSTRUCTION OR GANGRENE (K42.9)  Past Surgical History Mammie Lorenzo, LPN; 624THL [id number] AM) TURP  Allergies Mammie Lorenzo, LPN; 624THL [id number] AM) No Known Drug Allergies [03/26/2019]:  Medication History Mammie Lorenzo, LPN; 624THL 624THL AM) Eliquis (5MG  Tablet, 2.5mg  BID Oral) Active. hydroCHLOROthiazide (25MG  Tablet, Oral) Active. Lisinopril (20MG  Tablet, Oral) Active. Latanoprost (0.005% Solution, Ophthalmic) Active. Crestor (5MG  Tablet, Oral) Active. Medications Reconciled  Social History Mammie Lorenzo, LPN; 624THL [id number] AM) Alcohol use Moderate alcohol use. Caffeine use Carbonated beverages, Tea. No drug use Tobacco use Never smoker.  Family History Mammie Lorenzo, LPN; 624THL [id number] AM) Alcohol Abuse Father. Breast Cancer Mother. Depression Father. Heart Disease Father.  Other Problems Anthony Miles. Karmin Kasprzak, MD; 03/26/2019 10:18 AM) High blood pressure Hypercholesterolemia Prostate Cancer Pulmonary Embolism / Blood Clot in Legs     Review of Systems Claiborne Billings Dockery LPN; 624THL [id number] AM) General Not Present- Appetite Loss, Chills, Fatigue, Fever, Night Sweats, Weight Gain and Weight Loss. Skin Not Present- Change in Wart/Mole, Dryness, Hives,  Jaundice, New Lesions, Non-Healing Wounds, Rash and Ulcer. HEENT Present- Ringing in the Ears. Not Present- Earache, Hearing Loss, Hoarseness, Nose Bleed, Oral Ulcers, Seasonal Allergies, Sinus Pain, Sore Throat, Visual Disturbances, Wears glasses/contact lenses and Yellow Eyes. Respiratory Not Present- Bloody sputum, Chronic Cough, Difficulty Breathing, Snoring and Wheezing. Breast Not Present- Breast Mass, Breast Pain, Nipple Discharge and Skin Changes. Cardiovascular Not Present- Chest Pain, Difficulty Breathing Lying Down, Leg Cramps, Palpitations, Rapid Heart Rate, Shortness of Breath and Swelling of Extremities. Gastrointestinal Not Present- Abdominal Pain, Bloating, Bloody Stool, Change in Bowel Habits, Chronic diarrhea, Constipation, Difficulty Swallowing, Excessive gas, Gets full quickly at meals, Hemorrhoids, Indigestion, Nausea, Rectal Pain and Vomiting. Male Genitourinary Not Present- Blood in Urine, Change in Urinary Stream, Frequency, Impotence, Nocturia, Painful Urination, Urgency and Urine Leakage. Musculoskeletal Present- Joint Pain and Joint Stiffness. Not Present- Back Pain, Muscle Pain, Muscle Weakness and Swelling of Extremities. Neurological Not Present- Decreased Memory, Fainting, Headaches, Numbness, Seizures, Tingling, Tremor, Trouble walking and Weakness. Psychiatric Not Present- Anxiety, Bipolar, Change in Sleep Pattern, Depression, Fearful and Frequent crying. Endocrine Not Present- Cold Intolerance, Excessive Hunger, Hair Changes, Heat Intolerance, Hot flashes and New Diabetes. Hematology Present- Blood Thinners. Not Present- Easy Bruising, Excessive bleeding, Gland problems, HIV and Persistent Infections.  Vitals Claiborne Billings Dockery LPN; 624THL [id number] AM) 03/26/2019 9:33 AM Weight: 191.2 lb Height: 69in Body Surface Area: 2.03 m Body Mass Index: 28.24 kg/m  Temp.: 97.29F(Temporal)  Pulse: 79 (Regular)  BP: 136/82 (Sitting, Left Arm,  Standard)        Physical Exam Rodman Key K. Tykira Wachs MD; 03/26/2019 10:19 AM)  The physical exam findings are as follows: Note:WDWN in NAD Eyes: Pupils equal, round; sclera anicteric HENT: Oral mucosa moist; good dentition Neck: No masses palpated, no thyromegaly Lungs: CTA bilaterally; normal respiratory effort CV: Regular rate and  rhythm; no murmurs; extremities well-perfused with no edema Abd: +bowel sounds, soft, non-tender, no palpable organomegaly; healed laparoscopic incisions. Umbilicus - protruding bulge - easily reducible; 1 cm palpable defect Skin: Warm, dry; no sign of jaundice Psychiatric - alert and oriented x 4; calm mood and affect    Assessment & Plan Rodman Key K. Kassius Battiste MD; [id number] AM)  UMBILICAL HERNIA WITHOUT OBSTRUCTION OR GANGRENE (K42.9)  Current Plans Schedule for Surgery - Umbilical hernia repair with mesh. The surgical procedure has been discussed with the patient. Potential risks, benefits, alternative treatments, and expected outcomes have been explained. All of the patient's questions at this time have been answered. The likelihood of reaching the patient's treatment goal is good. The patient understand the proposed surgical procedure and wishes to proceed.   Stop Eliquis 3 days prior to surgery.  Anthony Miles. Georgette Dover, MD, [facility] [phone]  03/26/2019 10:19 AM

## 2019-05-09 NOTE — Progress Notes (Signed)
I called the nurse at [facility] triage desk  and requested Eliquis orders for this patient having UHR. Patient's MD prescribing this med is Dr Lake Bells for Hx PE.

## 2019-05-10 ENCOUNTER — Other Ambulatory Visit: Payer: Self-pay

## 2019-05-10 ENCOUNTER — Encounter (HOSPITAL_BASED_OUTPATIENT_CLINIC_OR_DEPARTMENT_OTHER): Payer: Self-pay | Admitting: *Deleted

## 2019-05-11 ENCOUNTER — Encounter (HOSPITAL_BASED_OUTPATIENT_CLINIC_OR_DEPARTMENT_OTHER)
Admission: RE | Admit: 2019-05-11 | Discharge: 2019-05-11 | Disposition: A | Discharge status: Home / Self Care | Payer: Medicare Other | Source: Ambulatory Visit | Attending: Surgery | Admitting: Surgery

## 2019-05-11 ENCOUNTER — Encounter (HOSPITAL_BASED_OUTPATIENT_CLINIC_OR_DEPARTMENT_OTHER): Payer: Self-pay | Admitting: *Deleted

## 2019-05-11 DIAGNOSIS — Z79899 Other long term (current) drug therapy: Secondary | ICD-10-CM | POA: Insufficient documentation

## 2019-05-11 DIAGNOSIS — Z01812 Encounter for preprocedural laboratory examination: Secondary | ICD-10-CM | POA: Insufficient documentation

## 2019-05-11 DIAGNOSIS — Z8546 Personal history of malignant neoplasm of prostate: Secondary | ICD-10-CM | POA: Diagnosis not present

## 2019-05-11 DIAGNOSIS — Z86711 Personal history of pulmonary embolism: Secondary | ICD-10-CM | POA: Insufficient documentation

## 2019-05-11 DIAGNOSIS — I10 Essential (primary) hypertension: Secondary | ICD-10-CM | POA: Insufficient documentation

## 2019-05-11 DIAGNOSIS — Z7901 Long term (current) use of anticoagulants: Secondary | ICD-10-CM | POA: Insufficient documentation

## 2019-05-11 DIAGNOSIS — K429 Umbilical hernia without obstruction or gangrene: Secondary | ICD-10-CM | POA: Insufficient documentation

## 2019-05-11 DIAGNOSIS — E7800 Pure hypercholesterolemia, unspecified: Secondary | ICD-10-CM | POA: Insufficient documentation

## 2019-05-11 LAB — BASIC METABOLIC PANEL
Anion gap: 9 (ref 5–15)
BUN: 20 mg/dL (ref 8–23)
CO2: 26 mmol/L (ref 22–32)
Calcium: 9.2 mg/dL (ref 8.9–10.3)
Chloride: 106 mmol/L (ref 98–111)
Creatinine, Ser: 1.48 mg/dL — ABNORMAL HIGH (ref 0.61–1.24)
GFR calc Af Amer: 54 mL/min — ABNORMAL LOW (ref >60)
GFR calc non Af Amer: 47 mL/min — ABNORMAL LOW (ref >60)
Glucose, Bld: 84 mg/dL (ref 70–99)
Potassium: 4.9 mmol/L (ref 3.5–5.1)
Sodium: 141 mmol/L (ref 135–145)

## 2019-05-11 NOTE — Progress Notes (Signed)
I received a return phone call from [facility] assistant, stating that Dr. Brigitte Pulse has given the ok for the pt to stop Eliquis 2 days prior to surgery and to resume asap. I called Kelly back at the office and left a message letting her know that I also received her voice mail. I will call and inform pt of Dr.Shaw's instructions.

## 2019-05-11 NOTE — Progress Notes (Signed)
I have left a voice mail on pts cell phone instructing him to stop his Eliquis 2 days prior to surgery and resume either that evening after surgery if he takes it twice a day or to resume the next morning if he only takes it once a day. These were instructions reported from [place] who cleared them with Dr. Brigitte Pulse.

## 2019-05-11 NOTE — Progress Notes (Signed)

## 2019-05-11 NOTE — Progress Notes (Signed)
After speaking with the pts wife, who informed me that Dr. Brigitte Pulse with [facility], is now the prescribing MD for his Eliquis. I called and left a message with Dr. Raul Del nurse Claiborne Billings asking her to please have Dr. Brigitte Pulse address this matter and to please call me back and let me know that this has been addressed and I can call the pt back to let him know how to handle this situation. Will continue to follow.

## 2019-05-12 ENCOUNTER — Other Ambulatory Visit (HOSPITAL_COMMUNITY)
Admission: RE | Admit: 2019-05-12 | Discharge: 2019-05-12 | Disposition: A | Discharge status: Home / Self Care | Payer: Medicare Other | Source: Ambulatory Visit | Attending: Surgery | Admitting: Surgery

## 2019-05-12 DIAGNOSIS — Z20828 Contact with and (suspected) exposure to other viral communicable diseases: Secondary | ICD-10-CM | POA: Insufficient documentation

## 2019-05-12 DIAGNOSIS — Z01812 Encounter for preprocedural laboratory examination: Secondary | ICD-10-CM | POA: Insufficient documentation

## 2019-05-13 LAB — NOVEL CORONAVIRUS, NAA (HOSP ORDER, SEND-OUT TO REF LAB; TAT 18-24 HRS): SARS-CoV-2, NAA: NOT DETECTED

## 2019-05-16 ENCOUNTER — Encounter (HOSPITAL_BASED_OUTPATIENT_CLINIC_OR_DEPARTMENT_OTHER)
Admission: RE | Disposition: A | Discharge status: Home / Self Care | Payer: Self-pay | Source: Home / Self Care | Attending: Surgery

## 2019-05-16 ENCOUNTER — Other Ambulatory Visit: Payer: Self-pay

## 2019-05-16 ENCOUNTER — Encounter (HOSPITAL_BASED_OUTPATIENT_CLINIC_OR_DEPARTMENT_OTHER): Payer: Self-pay | Admitting: *Deleted

## 2019-05-16 ENCOUNTER — Ambulatory Visit (HOSPITAL_BASED_OUTPATIENT_CLINIC_OR_DEPARTMENT_OTHER): Payer: Medicare Other | Admitting: Anesthesiology

## 2019-05-16 ENCOUNTER — Ambulatory Visit (HOSPITAL_BASED_OUTPATIENT_CLINIC_OR_DEPARTMENT_OTHER)
Admission: RE | Admit: 2019-05-16 | Discharge: 2019-05-16 | Disposition: A | Discharge status: Home / Self Care | Payer: Medicare Other | Attending: Surgery | Admitting: Surgery

## 2019-05-16 DIAGNOSIS — Z86711 Personal history of pulmonary embolism: Secondary | ICD-10-CM | POA: Diagnosis not present

## 2019-05-16 DIAGNOSIS — E7800 Pure hypercholesterolemia, unspecified: Secondary | ICD-10-CM | POA: Insufficient documentation

## 2019-05-16 DIAGNOSIS — Z79899 Other long term (current) drug therapy: Secondary | ICD-10-CM | POA: Insufficient documentation

## 2019-05-16 DIAGNOSIS — I10 Essential (primary) hypertension: Secondary | ICD-10-CM | POA: Diagnosis not present

## 2019-05-16 DIAGNOSIS — K429 Umbilical hernia without obstruction or gangrene: Secondary | ICD-10-CM | POA: Diagnosis not present

## 2019-05-16 DIAGNOSIS — I2692 Saddle embolus of pulmonary artery without acute cor pulmonale: Secondary | ICD-10-CM | POA: Diagnosis not present

## 2019-05-16 DIAGNOSIS — Z7901 Long term (current) use of anticoagulants: Secondary | ICD-10-CM | POA: Diagnosis not present

## 2019-05-16 DIAGNOSIS — I272 Pulmonary hypertension, unspecified: Secondary | ICD-10-CM | POA: Diagnosis not present

## 2019-05-16 DIAGNOSIS — Z8546 Personal history of malignant neoplasm of prostate: Secondary | ICD-10-CM | POA: Diagnosis not present

## 2019-05-16 DIAGNOSIS — E785 Hyperlipidemia, unspecified: Secondary | ICD-10-CM | POA: Insufficient documentation

## 2019-05-16 HISTORY — DX: Other pulmonary embolism without acute cor pulmonale: I26.99

## 2019-05-16 HISTORY — PX: UMBILICAL HERNIA REPAIR: SHX196

## 2019-05-16 SURGERY — REPAIR, HERNIA, UMBILICAL, ADULT
Anesthesia: General | Site: Abdomen

## 2019-05-16 MED ORDER — DEXAMETHASONE SODIUM PHOSPHATE 10 MG/ML IJ SOLN: 8.0000 mg | Freq: Once | INTRAMUSCULAR | Status: DC | PRN

## 2019-05-16 MED ORDER — DEXAMETHASONE SODIUM PHOSPHATE 4 MG/ML IJ SOLN
INTRAMUSCULAR | Status: DC | PRN
  Administered 2019-05-16: 10 mg via INTRAVENOUS

## 2019-05-16 MED ORDER — GABAPENTIN 300 MG PO CAPS: 300.0000 mg | ORAL_CAPSULE | ORAL | Status: DC

## 2019-05-16 MED ORDER — FENTANYL CITRATE (PF) 100 MCG/2ML IJ SOLN: 100.0000 ug | INTRAMUSCULAR | Status: DC | PRN

## 2019-05-16 MED ORDER — KETOROLAC TROMETHAMINE 30 MG/ML IJ SOLN
30.0000 mg | Freq: Once | INTRAMUSCULAR | Status: AC
  Administered 2019-05-16: 30 mg via INTRAVENOUS

## 2019-05-16 MED ORDER — FENTANYL CITRATE (PF) 100 MCG/2ML IJ SOLN: 25.0000 ug | INTRAMUSCULAR | Status: DC | PRN

## 2019-05-16 MED ORDER — ROCURONIUM BROMIDE 10 MG/ML (PF) SYRINGE
PREFILLED_SYRINGE | INTRAVENOUS | Status: AC
  Filled 2019-05-16: qty 10

## 2019-05-16 MED ORDER — PROPOFOL 500 MG/50ML IV EMUL
INTRAVENOUS | Status: AC
  Filled 2019-05-16: qty 50

## 2019-05-16 MED ORDER — IBUPROFEN 100 MG/5ML PO SUSP: 200.0000 mg | Freq: Four times a day (QID) | ORAL | Status: DC | PRN

## 2019-05-16 MED ORDER — CEFAZOLIN SODIUM-DEXTROSE 2-4 GM/100ML-% IV SOLN
INTRAVENOUS | Status: AC
  Filled 2019-05-16: qty 100

## 2019-05-16 MED ORDER — CHLORHEXIDINE GLUCONATE CLOTH 2 % EX PADS: 6.0000 | MEDICATED_PAD | Freq: Once | CUTANEOUS | Status: DC

## 2019-05-16 MED ORDER — DEXAMETHASONE SODIUM PHOSPHATE 10 MG/ML IJ SOLN
INTRAMUSCULAR | Status: AC
  Filled 2019-05-16: qty 1

## 2019-05-16 MED ORDER — BUPIVACAINE HCL (PF) 0.25 % IJ SOLN
INTRAMUSCULAR | Status: AC
  Filled 2019-05-16: qty 60

## 2019-05-16 MED ORDER — GABAPENTIN 300 MG PO CAPS
ORAL_CAPSULE | ORAL | Status: AC
  Filled 2019-05-16: qty 1

## 2019-05-16 MED ORDER — OXYCODONE HCL 5 MG PO TABS: 5.0000 mg | ORAL_TABLET | Freq: Once | ORAL | Status: DC | PRN

## 2019-05-16 MED ORDER — PROPOFOL 10 MG/ML IV BOLUS
INTRAVENOUS | Status: DC | PRN
  Administered 2019-05-16: 50 mg via INTRAVENOUS
  Administered 2019-05-16: 150 mg via INTRAVENOUS
  Administered 2019-05-16: 50 mg via INTRAVENOUS

## 2019-05-16 MED ORDER — MIDAZOLAM HCL 2 MG/2ML IJ SOLN
INTRAMUSCULAR | Status: AC
  Filled 2019-05-16: qty 2

## 2019-05-16 MED ORDER — BUPIVACAINE HCL (PF) 0.25 % IJ SOLN
INTRAMUSCULAR | Status: DC | PRN
  Administered 2019-05-16: 10 mL

## 2019-05-16 MED ORDER — ACETAMINOPHEN 500 MG PO TABS
ORAL_TABLET | ORAL | Status: AC
  Filled 2019-05-16: qty 2

## 2019-05-16 MED ORDER — OXYCODONE HCL 5 MG/5ML PO SOLN: 5.0000 mg | Freq: Once | ORAL | Status: DC | PRN

## 2019-05-16 MED ORDER — KETOROLAC TROMETHAMINE 15 MG/ML IJ SOLN: 15.0000 mg | Freq: Once | INTRAMUSCULAR | Status: DC

## 2019-05-16 MED ORDER — LACTATED RINGERS IV SOLN
INTRAVENOUS | Status: DC
  Administered 2019-05-16 (×2): via INTRAVENOUS

## 2019-05-16 MED ORDER — MIDAZOLAM HCL 2 MG/2ML IJ SOLN: 1.0000 mg | INTRAMUSCULAR | Status: DC | PRN

## 2019-05-16 MED ORDER — EPHEDRINE SULFATE 50 MG/ML IJ SOLN
INTRAMUSCULAR | Status: DC | PRN
  Administered 2019-05-16 (×2): 10 mg via INTRAVENOUS

## 2019-05-16 MED ORDER — ACETAMINOPHEN 500 MG PO TABS: 1000.0000 mg | ORAL_TABLET | ORAL | Status: DC

## 2019-05-16 MED ORDER — FENTANYL CITRATE (PF) 100 MCG/2ML IJ SOLN
INTRAMUSCULAR | Status: AC
  Filled 2019-05-16: qty 2

## 2019-05-16 MED ORDER — IBUPROFEN 200 MG PO TABS: 200.0000 mg | ORAL_TABLET | Freq: Four times a day (QID) | ORAL | Status: DC | PRN

## 2019-05-16 MED ORDER — FENTANYL CITRATE (PF) 100 MCG/2ML IJ SOLN
INTRAMUSCULAR | Status: DC | PRN
  Administered 2019-05-16: 100 ug via INTRAVENOUS

## 2019-05-16 MED ORDER — LIDOCAINE HCL (CARDIAC) PF 100 MG/5ML IV SOSY
PREFILLED_SYRINGE | INTRAVENOUS | Status: DC | PRN
  Administered 2019-05-16: 50 mg via INTRAVENOUS

## 2019-05-16 MED ORDER — LIDOCAINE 2% (20 MG/ML) 5 ML SYRINGE
INTRAMUSCULAR | Status: AC
  Filled 2019-05-16: qty 5

## 2019-05-16 MED ORDER — KETOROLAC TROMETHAMINE 30 MG/ML IJ SOLN
INTRAMUSCULAR | Status: AC
  Filled 2019-05-16: qty 1

## 2019-05-16 MED ORDER — CEFAZOLIN SODIUM-DEXTROSE 2-4 GM/100ML-% IV SOLN
2.0000 g | INTRAVENOUS | Status: AC
  Administered 2019-05-16: 2 g via INTRAVENOUS

## 2019-05-16 MED ORDER — OXYCODONE HCL 5 MG PO TABS: 5.0000 mg | ORAL_TABLET | Freq: Four times a day (QID) | ORAL | 0 refills | Status: DC | PRN

## 2019-05-16 MED ORDER — ONDANSETRON HCL 4 MG/2ML IJ SOLN
INTRAMUSCULAR | Status: DC | PRN
  Administered 2019-05-16: 4 mg via INTRAVENOUS

## 2019-05-16 MED ORDER — ONDANSETRON HCL 4 MG/2ML IJ SOLN
INTRAMUSCULAR | Status: AC
  Filled 2019-05-16: qty 2

## 2019-05-16 MED ORDER — MEPERIDINE HCL 25 MG/ML IJ SOLN: 6.2500 mg | INTRAMUSCULAR | Status: DC | PRN

## 2019-05-16 SURGICAL SUPPLY — 56 items
APPLICATOR COTTON TIP 6 STRL (MISCELLANEOUS) IMPLANT
APPLICATOR COTTON TIP 6IN STRL (MISCELLANEOUS)
BENZOIN TINCTURE PRP APPL 2/3 (GAUZE/BANDAGES/DRESSINGS) ×2 IMPLANT
BLADE CLIPPER SURG (BLADE) ×2 IMPLANT
BLADE HEX COATED 2.75 (ELECTRODE) ×2 IMPLANT
BLADE SURG 15 STRL LF DISP TIS (BLADE) ×1 IMPLANT
BLADE SURG 15 STRL SS (BLADE) ×1
CANISTER SUCT 1200ML W/VALVE (MISCELLANEOUS) IMPLANT
CHLORAPREP W/TINT 26 (MISCELLANEOUS) ×2 IMPLANT
COVER BACK TABLE REUSABLE LG (DRAPES) ×2 IMPLANT
COVER MAYO STAND REUSABLE (DRAPES) ×2 IMPLANT
COVER WAND RF STERILE (DRAPES) IMPLANT
DECANTER SPIKE VIAL GLASS SM (MISCELLANEOUS) IMPLANT
DRAPE LAPAROTOMY T 102X78X121 (DRAPES) ×2 IMPLANT
DRAPE UTILITY XL STRL (DRAPES) ×2 IMPLANT
DRSG TEGADERM 4X4.75 (GAUZE/BANDAGES/DRESSINGS) ×2 IMPLANT
ELECT REM PT RETURN 9FT ADLT (ELECTROSURGICAL) ×2
ELECTRODE REM PT RTRN 9FT ADLT (ELECTROSURGICAL) ×1 IMPLANT
GAUZE SPONGE 4X4 12PLY STRL LF (GAUZE/BANDAGES/DRESSINGS) IMPLANT
GLOVE BIO SURGEON STRL SZ 6.5 (GLOVE) ×2 IMPLANT
GLOVE BIO SURGEON STRL SZ7 (GLOVE) ×2 IMPLANT
GLOVE BIOGEL PI IND STRL 7.5 (GLOVE) ×1 IMPLANT
GLOVE BIOGEL PI INDICATOR 7.5 (GLOVE) ×1
GOWN STRL REUS W/ TWL LRG LVL3 (GOWN DISPOSABLE) ×2 IMPLANT
GOWN STRL REUS W/TWL LRG LVL3 (GOWN DISPOSABLE) ×2
MESH VENTRALEX ST 1-7/10 CRC S (Mesh General) ×2 IMPLANT
NDL SAFETY ECLIPSE 18X1.5 (NEEDLE) IMPLANT
NEEDLE HYPO 18GX1.5 SHARP (NEEDLE)
NEEDLE HYPO 25X1 1.5 SAFETY (NEEDLE) ×2 IMPLANT
NS IRRIG 1000ML POUR BTL (IV SOLUTION) ×2 IMPLANT
PACK BASIN DAY SURGERY FS (CUSTOM PROCEDURE TRAY) ×2 IMPLANT
PENCIL SMOKE EVACUATOR (MISCELLANEOUS) ×2 IMPLANT
SLEEVE SCD COMPRESS KNEE MED (MISCELLANEOUS) ×2 IMPLANT
SPONGE GAUZE 2X2 8PLY STRL LF (GAUZE/BANDAGES/DRESSINGS) ×2 IMPLANT
STAPLER VISISTAT 35W (STAPLE) IMPLANT
STRIP CLOSURE SKIN 1/2X4 (GAUZE/BANDAGES/DRESSINGS) ×2 IMPLANT
SUT MNCRL AB 4-0 PS2 18 (SUTURE) ×2 IMPLANT
SUT NOVA 0 T19/GS 22DT (SUTURE) ×2 IMPLANT
SUT NOVA NAB DX-16 0-1 5-0 T12 (SUTURE) IMPLANT
SUT NOVA NAB GS-21 1 T12 (SUTURE) IMPLANT
SUT PDS AB 0 CT 36 (SUTURE) IMPLANT
SUT PROLENE 0 CT 2 (SUTURE) IMPLANT
SUT SILK 3 0 TIES 17X18 (SUTURE)
SUT SILK 3-0 18XBRD TIE BLK (SUTURE) IMPLANT
SUT VIC AB 2-0 CT1 27 (SUTURE)
SUT VIC AB 2-0 CT1 TAPERPNT 27 (SUTURE) IMPLANT
SUT VIC AB 3-0 SH 27 (SUTURE) ×1
SUT VIC AB 3-0 SH 27X BRD (SUTURE) ×1 IMPLANT
SUT VIC AB 4-0 BRD 54 (SUTURE) IMPLANT
SUT VIC AB 4-0 SH 18 (SUTURE) IMPLANT
SUT VICRYL 4-0 PS2 18IN ABS (SUTURE) IMPLANT
SYR BULB 3OZ (MISCELLANEOUS) IMPLANT
SYR CONTROL 10ML LL (SYRINGE) ×2 IMPLANT
TOWEL GREEN STERILE FF (TOWEL DISPOSABLE) ×2 IMPLANT
TUBE CONNECTING 20X1/4 (TUBING) IMPLANT
YANKAUER SUCT BULB TIP NO VENT (SUCTIONS) IMPLANT

## 2019-05-16 NOTE — H&P (Signed)
History of Present Illness  The patient is a 73 year old male who presents with an umbilical hernia. Referred by Dr. Dutch Gray for umbilical hernia  This is a 73 year old male who is status post robotic prostatectomy in 2016 for prostate cancer who presents with a two-year history of a slowly enlarging bulge at his umbilicus. This has become more uncomfortable. He denies any obstructive symptoms. He presents now to discuss hernia repair.  The patient had an unprovoked pulmonary embolus in 2018. He has recovered fully and is anticoagulated on Eliquis     Problem List/Past Medical  UMBILICAL HERNIA WITHOUT OBSTRUCTION OR GANGRENE (K42.9)  Past Surgical History  TURP  Allergies  No Known Drug Allergies  Medication History  Eliquis (5MG  Tablet, 2.5mg  BID Oral) Active. hydroCHLOROthiazide (25MG  Tablet, Oral) Active. Lisinopril (20MG  Tablet, Oral) Active. Latanoprost (0.005% Solution, Ophthalmic) Active. Crestor (5MG  Tablet, Oral) Active. Medications Reconciled  Social History  Alcohol use Moderate alcohol use. Caffeine use Carbonated beverages, Tea. No drug use Tobacco use Never smoker.  Family History Alcohol Abuse Father. Breast Cancer Mother. Depression Father. Heart Disease Father.  Other Problems High blood pressure Hypercholesterolemia Prostate Cancer Pulmonary Embolism / Blood Clot in Legs     Review of Systems  General Not Present- Appetite Loss, Chills, Fatigue, Fever, Night Sweats, Weight Gain and Weight Loss. Skin Not Present- Change in Wart/Mole, Dryness, Hives, Jaundice, New Lesions, Non-Healing Wounds, Rash and Ulcer. HEENT Present- Ringing in the Ears. Not Present- Earache, Hearing Loss, Hoarseness, Nose Bleed, Oral Ulcers, Seasonal Allergies, Sinus Pain, Sore Throat, Visual Disturbances, Wears glasses/contact lenses and Yellow Eyes. Respiratory Not Present- Bloody sputum, Chronic Cough, Difficulty Breathing,  Snoring and Wheezing. Breast Not Present- Breast Mass, Breast Pain, Nipple Discharge and Skin Changes. Cardiovascular Not Present- Chest Pain, Difficulty Breathing Lying Down, Leg Cramps, Palpitations, Rapid Heart Rate, Shortness of Breath and Swelling of Extremities. Gastrointestinal Not Present- Abdominal Pain, Bloating, Bloody Stool, Change in Bowel Habits, Chronic diarrhea, Constipation, Difficulty Swallowing, Excessive gas, Gets full quickly at meals, Hemorrhoids, Indigestion, Nausea, Rectal Pain and Vomiting. Male Genitourinary Not Present- Blood in Urine, Change in Urinary Stream, Frequency, Impotence, Nocturia, Painful Urination, Urgency and Urine Leakage. Musculoskeletal Present- Joint Pain and Joint Stiffness. Not Present- Back Pain, Muscle Pain, Muscle Weakness and Swelling of Extremities. Neurological Not Present- Decreased Memory, Fainting, Headaches, Numbness, Seizures, Tingling, Tremor, Trouble walking and Weakness. Psychiatric Not Present- Anxiety, Bipolar, Change in Sleep Pattern, Depression, Fearful and Frequent crying. Endocrine Not Present- Cold Intolerance, Excessive Hunger, Hair Changes, Heat Intolerance, Hot flashes and New Diabetes. Hematology Present- Blood Thinners. Not Present- Easy Bruising, Excessive bleeding, Gland problems, HIV and Persistent Infections.  Vitals  Weight: 191.2 lb Height: 69in Body Surface Area: 2.03 m Body Mass Index: 28.24 kg/m  Temp.: 97.28F(Temporal)  Pulse: 79 (Regular)  BP: 136/82 (Sitting, Left Arm, Standard)        Physical Exam   The physical exam findings are as follows: Note:WDWN in NAD Eyes: Pupils equal, round; sclera anicteric HENT: Oral mucosa moist; good dentition Neck: No masses palpated, no thyromegaly Lungs: CTA bilaterally; normal respiratory effort CV: Regular rate and rhythm; no murmurs; extremities well-perfused with no edema Abd: +bowel sounds, soft, non-tender, no palpable organomegaly;  healed laparoscopic incisions. Umbilicus - protruding bulge - easily reducible; 1 cm palpable defect Skin: Warm, dry; no sign of jaundice Psychiatric - alert and oriented x 4; calm mood and affect    Assessment & Plan   UMBILICAL HERNIA WITHOUT OBSTRUCTION OR GANGRENE (K42.9)  Current  Plans Schedule for Surgery - Umbilical hernia repair with mesh. The surgical procedure has been discussed with the patient. Potential risks, benefits, alternative treatments, and expected outcomes have been explained. All of the patient's questions at this time have been answered. The likelihood of reaching the patient's treatment goal is good. The patient understand the proposed surgical procedure and wishes to proceed.   Stop Eliquis 3 days prior to surgery.  [name]. Georgette Dover, MD, [facility]  General/ Trauma Surgery   05/16/2019 8:16 AM

## 2019-05-16 NOTE — Op Note (Signed)
Indications:  The patient presented with a history of an enlarging umbilical hernia after a robotic prostatectomy in 2016.  The patient was examined and we recommended umbilical hernia repair with mesh.  Pre-operative diagnosis:  Umbilical hernia  Post-operative diagnosis:  Same  Procedure:  Umbilical hernia repair with mesh  Procedure Details  The patient was seen again in the Holding Room. The risks, benefits, complications, treatment options, and expected outcomes were discussed with the patient. The possibilities of reaction to medication, pulmonary aspiration, perforation of viscus, bleeding, recurrent infection, the need for additional procedures, and development of a complication requiring transfusion or further operation were discussed with the patient and/or family. There was concurrence with the proposed plan, and informed consent was obtained. The site of surgery was properly noted/marked. The patient was taken to the Operating Room, identified as Anthony Miles, and the procedure verified as umbilical hernia repair. A Time Out was held and the above information confirmed.  After an adequate level of general anesthesia was obtained, the patient's abdomen was prepped with Chloraprep and draped in sterile fashion.  We made a transverse incision above the umbilicus.  Dissection was carried down to the hernia sac with cautery.  We dissected bluntly around the hernia sac down to the edge of the fascial defect.  We reduced the hernia sac back into the pre-peritoneal space.  The fascial defect measured 1 cm.  There was an adjacent 2 mm defect.  We cleared the fascia in all directions.  A small Ventralex mesh was inserted into the pre-peritoneal space and was deployed.  The mesh was secured with four trans-fascial sutures of 0 Novofil.  The fascial defect was closed with multiple interrupted figure-of-eight 0 Novofil sutures.  The base of the umbilicus was tacked down with 3-0 Vicryl.  3-0 Vicryl was  used to close the subcutaneous tissues and 4-0 Monocryl was used to close the skin.  Steri-strips and clean dressing were applied.  The patient was extubated and brought to the recovery room in stable condition.  All sponge, instrument, and needle counts were correct prior to closure and at the conclusion of the case.   Estimated Blood Loss: Minimal          Complications: None; patient tolerated the procedure well.         Disposition: PACU - hemodynamically stable.         Condition: stable  [name]. Georgette Dover, MD, [facility]  General/ Trauma Surgery   05/16/2019 9:07 AM

## 2019-05-16 NOTE — Anesthesia Procedure Notes (Signed)
Procedure Name: LMA Insertion Date/Time: 05/16/2019 8:32 AM Performed by: Marrianne Mood, CRNA Pre-anesthesia Checklist: Patient identified, Emergency Drugs available, Suction available, Patient being monitored and Timeout performed Patient Re-evaluated:Patient Re-evaluated prior to induction Oxygen Delivery Method: Circle system utilized Preoxygenation: Pre-oxygenation with 100% oxygen Induction Type: IV induction Ventilation: Mask ventilation without difficulty LMA: LMA inserted LMA Size: 5.0 Number of attempts: 1 Airway Equipment and Method: Bite block Placement Confirmation: positive ETCO2 Tube secured with: Tape Dental Injury: Teeth and Oropharynx as per pre-operative assessment

## 2019-05-16 NOTE — Anesthesia Preprocedure Evaluation (Addendum)
Anesthesia Evaluation  Patient identified by MRN, date of birth, ID band Patient awake    Reviewed: Allergy & Precautions, NPO status , Patient's Chart, lab work & pertinent test results  Airway Mallampati: I       Dental no notable dental hx. (+) Teeth Intact   Pulmonary    Pulmonary exam normal breath sounds clear to auscultation       Cardiovascular hypertension, Pt. on medications Normal cardiovascular exam Rhythm:Regular Rate:Normal     Neuro/Psych negative neurological ROS  negative psych ROS   GI/Hepatic negative GI ROS, Neg liver ROS,   Endo/Other  negative endocrine ROS  Renal/GU   negative genitourinary   Musculoskeletal negative musculoskeletal ROS (+)   Abdominal Normal abdominal exam  (+)   Peds  Hematology negative hematology ROS (+)   Anesthesia Other Findings ------------------------------------------------------------------- LV EF: 60% -   65%  ------------------------------------------------------------------- Indications:      I27.2 Pulmonary hypertension.  ------------------------------------------------------------------- History:   PMH:  Pulmonary embolism.  Dyspnea.  Risk factors: Hypertension. Dyslipidemia.  ------------------------------------------------------------------- Study Conclusions  - Left ventricle: The cavity size was normal. Systolic function was   normal. The estimated ejection fraction was in the range of 60%   to 65%. Wall motion was normal; there were no regional wall   motion abnormalities. Doppler parameters are consistent with   abnormal left ventricular relaxation (grade 1 diastolic   dysfunction). - Aortic valve: There was mild regurgitation. - Right ventricle: The cavity size was normal. Wall thickness was   normal. - Tricuspid valve: There was trivial regurgitation.  ------------------------------------------------------------------- Study data:   No prior study was available for comparison.  Study status:  Routine.  Procedure:  The patient reported no pain pre or post   Reproductive/Obstetrics                            Anesthesia Physical Anesthesia Plan  ASA: II  Anesthesia Plan: General   Post-op Pain Management:    Induction: Intravenous  PONV Risk Score and Plan: 4 or greater and Ondansetron and Treatment may vary due to age or medical condition  Airway Management Planned: LMA  Additional Equipment: None  Intra-op Plan:   Post-operative Plan: Extubation in OR  Informed Consent: I have reviewed the patients History and Physical, chart, labs and discussed the procedure including the risks, benefits and alternatives for the proposed anesthesia with the patient or authorized representative who has indicated his/her understanding and acceptance.     Dental advisory given  Plan Discussed with: CRNA  Anesthesia Plan Comments:        Anesthesia Quick Evaluation

## 2019-05-16 NOTE — Discharge Instructions (Signed)
[facility] _______[facility] ° °UMBILICAL OR INGUINAL HERNIA REPAIR: POST OP INSTRUCTIONS ° °Always review your discharge instruction sheet given to you by the facility where your surgery was performed. °IF YOU HAVE DISABILITY OR FAMILY LEAVE FORMS, YOU MUST BRING THEM TO THE OFFICE FOR PROCESSING.   °DO NOT GIVE THEM TO YOUR DOCTOR. ° °1. A  prescription for pain medication may be given to you upon discharge.  Take your pain medication as prescribed, if needed.  If narcotic pain medicine is not needed, then you may take acetaminophen (Tylenol) or ibuprofen (Advil) as needed. °2. Take your usually prescribed medications unless otherwise directed. °If you need a refill on your pain medication, please contact your pharmacy.  They will contact our office to request authorization. Prescriptions will not be filled after 5 pm or on week-ends. °3. You should follow a light diet the first 24 hours after arrival home, such as soup and crackers, etc.  Be sure to include lots of fluids daily.  Resume your normal diet the day after surgery. °4.Most patients will experience some swelling and bruising around the umbilicus or in the groin and scrotum.  Ice packs and reclining will help.  Swelling and bruising can take several days to resolve.  °6. It is common to experience some constipation if taking pain medication after surgery.  Increasing fluid intake and taking a stool softener (such as Colace) will usually help or prevent this problem from occurring.  A mild laxative (Milk of Magnesia or Miralax) should be taken according to package directions if there are no bowel movements after 48 hours. °7. Unless discharge instructions indicate otherwise, you may remove your bandages 24-48 hours after surgery, and you may shower at that time.  You may have steri-strips (small skin tapes) in place directly over the incision.  These strips should be left on the skin for 7-10 days.  If your surgeon used skin glue on the  incision, you may shower in 24 hours.  The glue will flake off over the next 2-3 weeks.  Any sutures or staples will be removed at the office during your follow-up visit. °8. ACTIVITIES:  You may resume regular (light) daily activities beginning the next day--such as daily self-care, walking, climbing stairs--gradually increasing activities as tolerated.  You may have sexual intercourse when it is comfortable.  Refrain from any heavy lifting or straining until approved by your doctor. ° °a.You may drive when you are no longer taking prescription pain medication, you can comfortably wear a seatbelt, and you can safely maneuver your car and apply brakes. °b.RETURN TO WORK:   °_____________________________________________ ° °9.You should see your doctor in the office for a follow-up appointment approximately 2-3 weeks after your surgery.  Make sure that you call for this appointment within a day or two after you arrive home to insure a convenient appointment time. °10.OTHER INSTRUCTIONS: _________________________ °   _____________________________________ ° °WHEN TO CALL YOUR DOCTOR: °1. Fever over 101.0 °2. Inability to urinate °3. Nausea and/or vomiting °4. Extreme swelling or bruising °5. Continued bleeding from incision. °6. Increased pain, redness, or drainage from the incision ° °The clinic staff is available to answer your questions during regular business hours.  Please don’t hesitate to call and ask to speak to one of the nurses for clinical concerns.  If you have a medical emergency, go to the nearest emergency room or call 911.  A surgeon from [facility] is always on call at the hospital ° ° °  [address], [place], [place], [place]  [address] ?  [address], [place], [place]   [phone] ? [phone] ? FAX [phone] Web site: [email or web address]  No additional ibuprofen until after 5pm today.      Post Anesthesia Home Care  Instructions  Activity: Get plenty of rest for the remainder of the day. A responsible individual must stay with you for 24 hours following the procedure.  For the next 24 hours, DO NOT: -Drive a car -Paediatric nurse -Drink alcoholic beverages -Take any medication unless instructed by your physician -Make any legal decisions or sign important papers.  Meals: Start with liquid foods such as gelatin or soup. Progress to regular foods as tolerated. Avoid greasy, spicy, heavy foods. If nausea and/or vomiting occur, drink only clear liquids until the nausea and/or vomiting subsides. Call your physician if vomiting continues.  Special Instructions/Symptoms: Your throat may feel dry or sore from the anesthesia or the breathing tube placed in your throat during surgery. If this causes discomfort, gargle with warm salt water. The discomfort should disappear within 24 hours.  If you had a scopolamine patch placed behind your ear for the management of post- operative nausea and/or vomiting:  1. The medication in the patch is effective for 72 hours, after which it should be removed.  Wrap patch in a tissue and discard in the trash. Wash hands thoroughly with soap and water. 2. You may remove the patch earlier than 72 hours if you experience unpleasant side effects which may include dry mouth, dizziness or visual disturbances. 3. Avoid touching the patch. Wash your hands with soap and water after contact with the patch.

## 2019-05-16 NOTE — Transfer of Care (Signed)
Immediate Anesthesia Transfer of Care Note  Patient: Anthony Miles  Procedure(s) Performed: UMBILICAL HERNIA REPAIR WITH MESH (N/A Abdomen)  Patient Location: PACU  Anesthesia Type:General  Level of Consciousness: awake and patient cooperative  Airway & Oxygen Therapy: Patient Spontanous Breathing and Patient connected to face mask oxygen  Post-op Assessment: Report given to RN and Post -op Vital signs reviewed and stable  Post vital signs: Reviewed and stable  Last Vitals:  Vitals Value Taken Time  BP 129/78 05/16/19 0913  Temp    Pulse 80 05/16/19 0914  Resp 21 05/16/19 0914  SpO2 100 % 05/16/19 0914  Vitals shown include unvalidated device data.  Last Pain:  Vitals:   05/16/19 0659  TempSrc: Oral  PainSc: 0-No pain         Complications: No apparent anesthesia complications

## 2019-05-16 NOTE — Anesthesia Postprocedure Evaluation (Signed)
Anesthesia Post Note  Patient: Anthony Miles  Procedure(s) Performed: UMBILICAL HERNIA REPAIR WITH MESH (N/A Abdomen)     Patient location during evaluation: PACU Anesthesia Type: General Level of consciousness: awake Pain management: pain level controlled Vital Signs Assessment: post-procedure vital signs reviewed and stable Respiratory status: spontaneous breathing Cardiovascular status: stable Postop Assessment: no apparent nausea or vomiting Anesthetic complications: no    Last Vitals:  Vitals:   05/16/19 0913 05/16/19 0930  BP: 129/78 127/73  Pulse: 77 69  Resp: 18 18  Temp: 36.5 C   SpO2: 100% 100%    Last Pain:  Vitals:   05/16/19 0930  TempSrc:   PainSc: 3    Pain Goal:                   Huston Foley

## 2019-05-17 ENCOUNTER — Encounter (HOSPITAL_BASED_OUTPATIENT_CLINIC_OR_DEPARTMENT_OTHER): Payer: Self-pay | Admitting: Surgery

## 2019-07-06 DIAGNOSIS — R300 Dysuria: Secondary | ICD-10-CM | POA: Diagnosis not present

## 2019-07-09 DIAGNOSIS — N3001 Acute cystitis with hematuria: Secondary | ICD-10-CM | POA: Diagnosis not present

## 2019-07-09 DIAGNOSIS — R300 Dysuria: Secondary | ICD-10-CM | POA: Diagnosis not present

## 2019-07-09 DIAGNOSIS — R3989 Other symptoms and signs involving the genitourinary system: Secondary | ICD-10-CM | POA: Diagnosis not present

## 2019-07-09 DIAGNOSIS — R310 Gross hematuria: Secondary | ICD-10-CM | POA: Diagnosis not present

## 2019-07-24 DIAGNOSIS — C61 Malignant neoplasm of prostate: Secondary | ICD-10-CM | POA: Diagnosis not present

## 2019-07-24 DIAGNOSIS — N3001 Acute cystitis with hematuria: Secondary | ICD-10-CM | POA: Diagnosis not present

## 2019-09-03 ENCOUNTER — Ambulatory Visit: Payer: Medicare Other

## 2019-09-25 DIAGNOSIS — D1801 Hemangioma of skin and subcutaneous tissue: Secondary | ICD-10-CM | POA: Diagnosis not present

## 2019-09-25 DIAGNOSIS — L821 Other seborrheic keratosis: Secondary | ICD-10-CM | POA: Diagnosis not present

## 2019-09-25 DIAGNOSIS — D1721 Benign lipomatous neoplasm of skin and subcutaneous tissue of right arm: Secondary | ICD-10-CM | POA: Diagnosis not present

## 2019-09-25 DIAGNOSIS — Z85828 Personal history of other malignant neoplasm of skin: Secondary | ICD-10-CM | POA: Diagnosis not present

## 2019-09-25 DIAGNOSIS — D1722 Benign lipomatous neoplasm of skin and subcutaneous tissue of left arm: Secondary | ICD-10-CM | POA: Diagnosis not present

## 2019-09-25 DIAGNOSIS — L570 Actinic keratosis: Secondary | ICD-10-CM | POA: Diagnosis not present

## 2019-09-25 DIAGNOSIS — L812 Freckles: Secondary | ICD-10-CM | POA: Diagnosis not present

## 2019-10-04 DIAGNOSIS — Z8546 Personal history of malignant neoplasm of prostate: Secondary | ICD-10-CM | POA: Diagnosis not present

## 2019-10-04 DIAGNOSIS — Z0000 Encounter for general adult medical examination without abnormal findings: Secondary | ICD-10-CM | POA: Diagnosis not present

## 2019-10-04 DIAGNOSIS — I10 Essential (primary) hypertension: Secondary | ICD-10-CM | POA: Diagnosis not present

## 2019-10-04 DIAGNOSIS — E785 Hyperlipidemia, unspecified: Secondary | ICD-10-CM | POA: Diagnosis not present

## 2019-10-04 DIAGNOSIS — R7301 Impaired fasting glucose: Secondary | ICD-10-CM | POA: Diagnosis not present

## 2019-10-11 DIAGNOSIS — Z0000 Encounter for general adult medical examination without abnormal findings: Secondary | ICD-10-CM | POA: Diagnosis not present

## 2019-10-11 DIAGNOSIS — I10 Essential (primary) hypertension: Secondary | ICD-10-CM | POA: Diagnosis not present

## 2019-10-11 DIAGNOSIS — D6869 Other thrombophilia: Secondary | ICD-10-CM | POA: Diagnosis not present

## 2019-10-11 DIAGNOSIS — I129 Hypertensive chronic kidney disease with stage 1 through stage 4 chronic kidney disease, or unspecified chronic kidney disease: Secondary | ICD-10-CM | POA: Diagnosis not present

## 2019-10-11 DIAGNOSIS — R7301 Impaired fasting glucose: Secondary | ICD-10-CM | POA: Diagnosis not present

## 2019-10-11 DIAGNOSIS — Z86010 Personal history of colonic polyps: Secondary | ICD-10-CM | POA: Diagnosis not present

## 2019-10-11 DIAGNOSIS — Z86711 Personal history of pulmonary embolism: Secondary | ICD-10-CM | POA: Diagnosis not present

## 2019-10-11 DIAGNOSIS — E785 Hyperlipidemia, unspecified: Secondary | ICD-10-CM | POA: Diagnosis not present

## 2019-10-11 DIAGNOSIS — Z8546 Personal history of malignant neoplasm of prostate: Secondary | ICD-10-CM | POA: Diagnosis not present

## 2019-10-11 DIAGNOSIS — N1831 Chronic kidney disease, stage 3a: Secondary | ICD-10-CM | POA: Diagnosis not present

## 2020-04-04 DIAGNOSIS — Z23 Encounter for immunization: Secondary | ICD-10-CM | POA: Diagnosis not present

## 2020-06-20 DIAGNOSIS — M25571 Pain in right ankle and joints of right foot: Secondary | ICD-10-CM | POA: Diagnosis not present

## 2020-06-20 DIAGNOSIS — M722 Plantar fascial fibromatosis: Secondary | ICD-10-CM | POA: Diagnosis not present

## 2020-07-03 DIAGNOSIS — M25571 Pain in right ankle and joints of right foot: Secondary | ICD-10-CM | POA: Diagnosis not present

## 2020-07-03 DIAGNOSIS — M722 Plantar fascial fibromatosis: Secondary | ICD-10-CM | POA: Diagnosis not present

## 2021-06-09 ENCOUNTER — Other Ambulatory Visit: Payer: Self-pay

## 2021-06-09 ENCOUNTER — Encounter (HOSPITAL_BASED_OUTPATIENT_CLINIC_OR_DEPARTMENT_OTHER): Payer: Self-pay | Admitting: Cardiology

## 2021-06-09 ENCOUNTER — Ambulatory Visit (INDEPENDENT_AMBULATORY_CARE_PROVIDER_SITE_OTHER): Payer: Medicare Other | Admitting: Cardiology

## 2021-06-09 VITALS — BP 130/70 | HR 64 | Ht 70.0 in | Wt 186.0 lb

## 2021-06-09 DIAGNOSIS — I10 Essential (primary) hypertension: Secondary | ICD-10-CM

## 2021-06-09 DIAGNOSIS — Z86711 Personal history of pulmonary embolism: Secondary | ICD-10-CM | POA: Diagnosis not present

## 2021-06-09 DIAGNOSIS — Z7189 Other specified counseling: Secondary | ICD-10-CM | POA: Diagnosis not present

## 2021-06-09 DIAGNOSIS — Z9189 Other specified personal risk factors, not elsewhere classified: Secondary | ICD-10-CM | POA: Insufficient documentation

## 2021-06-09 DIAGNOSIS — E782 Mixed hyperlipidemia: Secondary | ICD-10-CM | POA: Diagnosis not present

## 2021-06-09 MED ORDER — ROSUVASTATIN CALCIUM 20 MG PO TABS
20.0000 mg | ORAL_TABLET | Freq: Every day | ORAL | 3 refills | Status: DC
Start: 2021-06-09 — End: 2022-07-15

## 2021-06-09 NOTE — Progress Notes (Signed)
Cardiology Office Note:    Date:  06/09/2021   ID:  Ber[place], Vanaken 07-29-1945, MRN [id number]  PCP:  Ginger Organ., MD  Cardiologist:  Buford Dresser, MD  Referring MD: Ginger Organ., MD   CC: new patient evaluation for cardiovascular risk   History of Present Illness:    Anthony Miles is a 75 y.o. male with a hx of hypertension, hyperlipidemia, pulmonary embolism, and prostate cancer, who is seen as a new consult at the request of Ginger Organ., MD for the evaluation of cardiovascular risk and to establish care. His wife is also a patient of mine.  On 04/07/2017 he was admitted to [facility] for acute pulmonary embolism, and acute LE DVT. CTPA revealed a large saddle embolus. LE Ultrasound found nearly occlusive thrombus in the left popliteal and posterior tibial veins, no DVT in right LE. Echo showed mild to moderate RV dysfunction.   After 3 close friends recently needed cardiovascular surgery, he became concerned about his health and wanted to assess his current situation.  Cardiovascular risk factors: Prior clinical ASCVD: None Comorbid conditions: He endorses borderline hypertension, on HCTZ and lisinopril. He wonders if his medication is too effective, he has noticed readings as low as 100/58 in the mornings. However, he has only noticed dizziness if he stands up too quickly after sitting for extended periods. Hyperlipidemia, on medication for 15 years, typically controlled. He cuts his rosuvastatin tablets in half. Metabolic syndrome/Obesity: Highest adult weight 192 lbs. Monitors his weight daily, weighed 175 about 15 years ago. Chronic inflammatory conditions: None Tobacco use history: None Family history:  Maternal no. Father and his brothers all had rheumatic fever, 2 of the brothers died in their 29's. Father had no other heart conditions. Prior cardiac testing and/or incidental findings on other testing (ie  coronary calcium): Ultrasound 2018 concerning thrombus. Exercise level: Tries to be active every day. Walks while playing golf and pushes a cart for his clubs, hikes 10-15 miles a week, Lifts light weights about twice a week. He notes that he now knows the difference between breathing heavily and gasping for breath after going through his PE, therefore he denies any concerning chest pain or shortness of breath. The only pains he has includes tendonitis and arthritis, which he treats with topical medications. Current diet: "Anything I want." Lot of salads and vegetables, typically avoids desserts.  Occasionally has mild lightheadedness that requires him to steady himself momentarily.   Also endorses easily bruising and bleeding on Eliquis. For pain management he will take tylenol.  This summer he had a spontaneous fracture in the second metatarsal of his right foot. Currently he is taking a calcium supplement. 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  Of note, he reports having a UTI infection a few years ago that was significant for hematuria and blood clots.   He denies any palpitations. No headaches, syncope, orthopnea, PND, or lower extremity edema.  Past Medical History:  Diagnosis Date   Hyperlipidemia    Hypertension    Prostate cancer ([place]) 2016   Pulmonary embolism ([place])    saddle PE    Past Surgical History:  Procedure Laterality Date   LYMPHADENECTOMY Bilateral 04/10/2015   Procedure: LYMPHADENECTOMY;  Surgeon: Raynelle Bring, MD;  Location: [facility];  Service: Urology;  Laterality: Bilateral;   ROBOT ASSISTED LAPAROSCOPIC RADICAL PROSTATECTOMY N/A 04/10/2015   Procedure: ROBOTIC ASSISTED LAPAROSCOPIC RADICAL PROSTATECTOMY LEVEL 2;  Surgeon: Wynetta Emery  Alinda Money, MD;  Location: [facility];  Service: Urology;  Laterality: N/A;   TONSILLECTOMY  3295   UMBILICAL HERNIA REPAIR N/A 05/16/2019   Procedure: UMBILICAL HERNIA REPAIR WITH MESH;  Surgeon: Donnie Mesa, MD;  Location:  [place];  Service: General;  Laterality: N/A;   VASECTOMY  1978    Current Medications: Current Outpatient Medications on File Prior to Visit  Medication Sig   apixaban (ELIQUIS) 2.5 MG TABS tablet Take 1 tablet (2.5 mg total) by mouth 2 (two) times daily.   calcium carbonate (OSCAL) 1500 (600 Ca) MG TABS tablet Take by mouth 2 (two) times daily with a meal.   hydrochlorothiazide (HYDRODIURIL) 25 MG tablet Take 25 mg by mouth daily.   lisinopril (ZESTRIL) 20 MG tablet Take 20 mg by mouth daily.   LUMIGAN 0.01 % SOLN 1 drop at bedtime.   Multiple Vitamin (MULTIVITAMIN) tablet Take 1 tablet by mouth daily.   oxyCODONE (OXY IR/ROXICODONE) 5 MG immediate release tablet Take 1 tablet (5 mg total) by mouth every 6 (six) hours as needed for severe pain.   sildenafil (REVATIO) 20 MG tablet as needed.   zolpidem (AMBIEN) 10 MG tablet    Current Facility-Administered Medications on File Prior to Visit  Medication   0.9 %  sodium chloride infusion     Allergies:   Patient has no known allergies.   Social History   Tobacco Use   Smoking status: Never   Smokeless tobacco: Never  Vaping Use   Vaping Use: Never used  Substance Use Topics   Alcohol use: Yes    Alcohol/week: 6.0 standard drinks    Types: 6 Standard drinks or equivalent per week    Comment: occassionally   Drug use: No    Family History: family history includes Breast cancer in his mother. There is no history of Colon cancer, Esophageal cancer, Rectal cancer, or Stomach cancer.  ROS:   Please see the history of present illness.  Additional pertinent ROS: Constitutional: Negative for chills, fever, night sweats, unintentional weight loss  HENT: Negative for ear pain and hearing loss.   Eyes: Negative for loss of vision and eye pain.  Respiratory: Negative for cough, sputum, wheezing.   Cardiovascular: See HPI. Gastrointestinal: Negative for abdominal pain, melena, and hematochezia.  Genitourinary:  Negative for dysuria and hematuria.  Musculoskeletal: Negative for falls and myalgias.  Skin: Negative for itching and rash.  Neurological: Negative for focal weakness, focal sensory changes and loss of consciousness. Positive for lightheadedness. Endo/Heme/Allergies: Does bruise easily.     EKGs/Labs/Other Studies Reviewed:    The following studies were reviewed today:  Echo 09/22/2017: Study Conclusions   - Left ventricle: The cavity size was normal. Systolic function was    normal. The estimated ejection fraction was in the range of 60%    to 65%. Wall motion was normal; there were no regional wall    motion abnormalities. Doppler parameters are consistent with    abnormal left ventricular relaxation (grade 1 diastolic    dysfunction).  - Aortic valve: There was mild regurgitation.  - Right ventricle: The cavity size was normal. Wall thickness was    normal.  - Tricuspid valve: There was trivial regurgitation.   EKG:  EKG is personally reviewed.   06/09/2021: NSR at 64 bpm  Recent Labs: No results found for requested labs within last 8760 hours.   Recent Lipid Panel No results found for: CHOL, TRIG, HDL, CHOLHDL, VLDL, LDLCALC, LDLDIRECT  Physical Exam:  VS:  BP 130/70   Pulse 64   Ht 5\' 10"  (1.778 m)   Wt 186 lb (84.4 kg)   SpO2 97%   BMI 26.69 kg/m     Wt Readings from Last 3 Encounters:  06/09/21 186 lb (84.4 kg)  05/16/19 188 lb 11.4 oz (85.6 kg)  09/28/17 175 lb (79.4 kg)    GEN: Well nourished, well developed in no acute distress HEENT: Normal, moist mucous membranes NECK: No JVD CARDIAC: regular rhythm, normal S1 and S2, no rubs or gallops. No murmur. VASCULAR: Radial and DP pulses 2+ bilaterally. No carotid bruits RESPIRATORY:  Clear to auscultation without rales, wheezing or rhonchi  ABDOMEN: Soft, non-tender, non-distended MUSCULOSKELETAL:  Ambulates independently SKIN: Warm and dry, no edema NEUROLOGIC:  Alert and oriented x 3. No focal neuro  deficits noted. PSYCHIATRIC:  Normal affect    ASSESSMENT:    1. Essential hypertension   2. Mixed hyperlipidemia   3. Cardiac risk counseling   4. Counseling on health promotion and disease prevention   5. History of pulmonary embolism   6. At increased risk for cardiovascular disease    PLAN:    Hypertension -at goal today, continue lisinopril and HCTZ  Mixed hyperlipidemia Elevated ASCVD risk -10 year ascvd 24.8% (ideal 16.2) lipids per [facility]: 10/04/19 Tchol 182, HDL 51, LDL 86, TG 226 A1c 5.7 -we discussed options, including staying the course on 10 mg rosuvastatin (LDL 86), increasing rosuvastatin to 20 mg daily to aim for aggressive management (LDL <70), or using calcium score to determine if he is higher than average calcium to determine intensity of therapy -after shared decision making, wishes to increase to 20 mg rosuvastatin daily -recheck lipids in 3 mos -counseled on red flag warning signs that need immediate medical attention  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 Limit salt. -recommend moderate walking, 3-5 times/week for 30-50 minutes each session. Aim for at least 150 minutes.week. Goal should be pace of 3 miles/hours, or walking 1.5 miles in 30 minutes -recommend avoidance of tobacco products. Avoid excess alcohol.    Plan for follow up: 1 year or sooner as needed.  Buford Dresser, MD, PhD, [facility]    Medication Adjustments/Labs and Tests Ordered: Current medicines are reviewed at length with the patient today.  Concerns regarding medicines are outlined above.   Orders Placed This Encounter  Procedures   Lipid panel    Meds ordered this encounter  Medications   rosuvastatin (CRESTOR) 20 MG tablet    Sig: Take 1 tablet (20 mg  total) by mouth daily.    Dispense:  90 tablet    Refill:  3    Please wait to fill until patient calls, he has enough currently    Patient Instructions  Medication Instructions:  INCREASE YOUR ROSUVASTATIN TO 20 MG DAILY   *If you need a refill on your cardiac medications before your next appointment, please call your pharmacy*  Lab Work: FASTING LIPIDS IN 3  MONTHS  Testing/Procedures: NONE  Follow-Up: At [facility], you and your health needs are our priority.  As part of our continuing mission to provide you with exceptional heart care, we have created designated Provider Care Teams.  These Care Teams include your primary Cardiologist (physician) and Advanced Practice Providers (APPs -  Physician Assistants and Nurse Practitioners) who all work  together to provide you with the care you need, when you need it.  We recommend signing up for the patient portal called "MyChart".  Sign up information is provided on this After Visit Summary.  MyChart is used to connect with patients for Virtual Visits (Telemedicine).  Patients are able to view lab/test results, encounter notes, upcoming appointments, etc.  Non-urgent messages can be sent to your provider as well.   To learn more about what you can do with MyChart, go to [email or web address].    Your next appointment:   12 MONTHS   The format for your next appointment:   In Person  Provider:   Buford Dresser, MD       [place] Stumpf,acting as a scribe for Buford Dresser, MD.,have documented all relevant documentation on the behalf of Buford Dresser, MD,as directed by  Buford Dresser, MD while in the presence of Buford Dresser, MD.  I, Buford Dresser, MD, have reviewed all documentation for this visit. The documentation on 06/09/21 for the exam, diagnosis, procedures, and orders are all accurate and complete.   Signed, Buford Dresser, MD PhD 06/09/2021  [facility]

## 2021-06-09 NOTE — Patient Instructions (Addendum)
Medication Instructions:  INCREASE YOUR ROSUVASTATIN TO 20 MG DAILY   *If you need a refill on your cardiac medications before your next appointment, please call your pharmacy*  Lab Work: FASTING LIPIDS IN 3  MONTHS  Testing/Procedures: NONE  Follow-Up: At [facility], you and your health needs are our priority.  As part of our continuing mission to provide you with exceptional heart care, we have created designated Provider Care Teams.  These Care Teams include your primary Cardiologist (physician) and Advanced Practice Providers (APPs -  Physician Assistants and Nurse Practitioners) who all work together to provide you with the care you need, when you need it.  We recommend signing up for the patient portal called "MyChart".  Sign up information is provided on this After Visit Summary.  MyChart is used to connect with patients for Virtual Visits (Telemedicine).  Patients are able to view lab/test results, encounter notes, upcoming appointments, etc.  Non-urgent messages can be sent to your provider as well.   To learn more about what you can do with MyChart, go to [email or web address].    Your next appointment:   12 MONTHS   The format for your next appointment:   In Person  Provider:   Buford Dresser, MD

## 2021-06-10 NOTE — Addendum Note (Signed)
Addended by: Ernie Hew D on: 06/10/2021 09:22 AM   Modules accepted: Orders

## 2021-09-29 LAB — LIPID PANEL
Chol/HDL Ratio: 3.1 ratio (ref 0.0–5.0)
Cholesterol, Total: 155 mg/dL (ref 100–199)
HDL: 50 mg/dL (ref >39)
LDL Chol Calc (NIH): 69 mg/dL (ref 0–99)
Triglycerides: 221 mg/dL — ABNORMAL HIGH (ref 0–149)
VLDL Cholesterol Cal: 36 mg/dL (ref 5–40)

## 2021-10-13 ENCOUNTER — Encounter: Payer: Self-pay | Admitting: Internal Medicine

## 2022-02-17 ENCOUNTER — Encounter: Payer: Self-pay | Admitting: Physician Assistant

## 2022-03-23 ENCOUNTER — Ambulatory Visit: Payer: Medicare Other | Admitting: Physician Assistant

## 2022-03-26 ENCOUNTER — Encounter: Payer: Self-pay | Admitting: *Deleted

## 2022-04-02 ENCOUNTER — Encounter: Payer: Self-pay | Admitting: Physician Assistant

## 2022-04-02 ENCOUNTER — Ambulatory Visit (INDEPENDENT_AMBULATORY_CARE_PROVIDER_SITE_OTHER): Payer: Medicare Other | Admitting: Physician Assistant

## 2022-04-02 VITALS — BP 118/62 | HR 85 | Ht 69.0 in | Wt 188.1 lb

## 2022-04-02 DIAGNOSIS — Z7901 Long term (current) use of anticoagulants: Secondary | ICD-10-CM | POA: Diagnosis not present

## 2022-04-02 DIAGNOSIS — Z86711 Personal history of pulmonary embolism: Secondary | ICD-10-CM

## 2022-04-02 DIAGNOSIS — Z86010 Personal history of colonic polyps: Secondary | ICD-10-CM

## 2022-04-02 MED ORDER — NA SULFATE-K SULFATE-MG SULF 17.5-3.13-1.6 GM/177ML PO SOLN: ORAL | 0 refills | Status: DC

## 2022-04-02 NOTE — Progress Notes (Signed)
Assessment and plan noted ?

## 2022-04-02 NOTE — Patient Instructions (Signed)
You have been scheduled for a colonoscopy. Please follow written instructions given to you at your visit today.  Please pick up your prep supplies at the pharmacy within the next 1-3 days. If you use inhalers (even only as needed), please bring them with you on the day of your procedure.  _______________________________________________________  If you are age 74 or older, your body mass index should be between 23-30. Your Body mass index is 27.78 kg/m. If this is out of the aforementioned range listed, please consider follow up with your Primary Care Provider.  If you are age 56 or younger, your body mass index should be between 19-25. Your Body mass index is 27.78 kg/m. If this is out of the aformentioned range listed, please consider follow up with your Primary Care Provider.   ________________________________________________________  The [facility] providers would like to encourage you to use [facility] to communicate with providers for non-urgent requests or questions.  Due to long hold times on the telephone, sending your provider a message by [facility] may be a faster and more efficient way to get a response.  Please allow 48 business hours for a response.  Please remember that this is for non-urgent requests.  _______________________________________________________  Due to recent changes in healthcare laws, you may see the results of your imaging and laboratory studies on MyChart before your provider has had a chance to review them.  We understand that in some cases there may be results that are confusing or concerning to you. Not all laboratory results come back in the same time frame and the provider may be waiting for multiple results in order to interpret others.  Please give [place] 48 hours in order for your provider to thoroughly review all the results before contacting the office for clarification of your results.

## 2022-04-02 NOTE — Progress Notes (Signed)
Chief Complaint: History of adenomatous polyps, chronic anticoagulation  HPI:    Anthony Miles is a 76 year old male with a past medical history as listed below including pulmonary embolism and prostate cancer on Eliquis (09/22/2017 echo with normal LVEF 60-65%), known to Dr. Henrene Pastor, who was referred to me by Ginger Organ., MD for his history of adenomatous polyps and chronic anticoagulation.    09/28/2016 colonoscopy for surveillance given his personal history of adenomatous polyps on a colonoscopy 3 years prior.  At that time to 1-3 mm polyps in the descending and ascending colon otherwise normal.  Pathology showed tubular adenoma.  Repeat recommended in 5 years.    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  He has had to hold it before for hernia surgery.  Otherwise no GI complaints or concerns.    Denies fever, chills, weight loss, change in bowel habits or abdominal pain.  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[place])    Hyperlipidemia    Hypertension    Osteopenia    Prostate cancer ([place]) 2016   Pulmonary embolism ([facility])    saddle PE   Tubular adenoma of colon     Past Surgical History:  Procedure Laterality Date   LYMPHADENECTOMY Bilateral 04/10/2015   Procedure: LYMPHADENECTOMY;  Surgeon: Raynelle Bring, MD;  Location: [facility];  Service: Urology;  Laterality: Bilateral;   ROBOT ASSISTED LAPAROSCOPIC RADICAL PROSTATECTOMY N/A 04/10/2015   Procedure: ROBOTIC ASSISTED LAPAROSCOPIC RADICAL PROSTATECTOMY LEVEL 2;  Surgeon: Raynelle Bring, MD;  Location: [facility];  Service: Urology;  Laterality: N/A;   TONSILLECTOMY  6295   UMBILICAL HERNIA REPAIR N/A 05/16/2019   Procedure: UMBILICAL HERNIA REPAIR WITH MESH;  Surgeon: Donnie Mesa, MD;  Location: [facility];  Service: General;  Laterality: N/A;   VASECTOMY  1978    Current Outpatient Medications  Medication Sig Dispense Refill   apixaban (ELIQUIS) 2.5 MG TABS tablet Take 1 tablet (2.5 mg total) by mouth 2 (two) times daily. 60 tablet 5   calcium carbonate (OSCAL) 1500 (600 Ca) MG TABS tablet Take by mouth 2 (two) times daily with a meal.     hydrochlorothiazide (HYDRODIURIL) 25 MG tablet Take 25 mg by mouth daily.     lisinopril (ZESTRIL) 20 MG tablet Take 20 mg by mouth daily.     LUMIGAN 0.01 % SOLN 1 drop at bedtime.     Multiple Vitamin (MULTIVITAMIN) tablet Take 1 tablet by mouth daily.     oxyCODONE (OXY IR/ROXICODONE) 5 MG immediate release tablet Take 1 tablet (5 mg total) by mouth every 6 (six) hours as needed for severe pain. 15 tablet 0   rosuvastatin (CRESTOR) 20 MG tablet Take 1 tablet (20 mg total) by mouth daily. 90 tablet 3   sildenafil (REVATIO) 20 MG tablet as needed.     zolpidem (AMBIEN) 10 MG tablet      Current Facility-Administered Medications  Medication Dose Route Frequency Provider Last Rate Last Admin   0.9 %  sodium chloride infusion  500 mL Intravenous Continuous Irene Shipper, MD        Allergies as of 04/02/2022   (No Known Allergies)    Family History  Problem Relation  Age of Onset   Breast cancer Mother    Colon cancer Neg Hx    Esophageal cancer Neg Hx    Rectal cancer Neg Hx    Stomach cancer Neg Hx     Social History   Socioeconomic History   Marital status: Married    Spouse name: Not on file   Number of children: Not on file   Years of education: Not on file   Highest education level: Not on file  Occupational History   Not on file  Tobacco Use   Smoking status: Never   Smokeless tobacco: Never  Vaping Use   Vaping Use: Never used  Substance and Sexual Activity   Alcohol use: Yes    Alcohol/week: 6.0 standard drinks of alcohol    Types: 6 Standard drinks or equivalent per week     Comment: occassionally   Drug use: No   Sexual activity: Not on file  Other Topics Concern   Not on file  Social History Narrative   Not on file   Social Determinants of Health   Financial Resource Strain: Not on file  Food Insecurity: Not on file  Transportation Needs: Not on file  Physical Activity: Not on file  Stress: Not on file  Social Connections: Not on file  Intimate Partner Violence: Not on file    Review of Systems:    Constitutional: No weight loss, fever or chills Skin: No rash  Cardiovascular: No chest pain Respiratory: No SOB  Gastrointestinal: See HPI and otherwise negative Genitourinary: No dysuria  Neurological: No headache, dizziness or syncope Musculoskeletal: No new muscle or joint pain Hematologic: No bleeding Psychiatric: No history of depression or anxiety   Physical Exam:  Vital signs: BP 118/62   Pulse 85   Ht '5\' 9"'$  (1.753 m)   Wt 188 lb 2 oz (85.3 kg)   BMI 27.78 kg/m    Constitutional:   Pleasant Caucasian male appears to be in NAD, Well developed, Well nourished, alert and cooperative Respiratory: Respirations even and unlabored. Lungs clear to auscultation bilaterally.   No wheezes, crackles, or rhonchi.  Cardiovascular: Normal S1, S2. No MRG. Regular rate and rhythm. No peripheral edema, cyanosis or pallor.  Gastrointestinal:  Soft, nondistended, nontender. No rebound or guarding. Normal bowel sounds. No appreciable masses or hepatomegaly. Rectal:  Not performed.  Psychiatric: Oriented to person, place and time. Demonstrates good judgement and reason without abnormal affect or behaviors.  Labs in [place].  Assessment: 1.  History of adenomatous polyps: Last colonoscopy in 2018 with repeat recommended in 5 years 2.  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  Scheduled surveillance colonoscopy in the [place] with Dr. Henrene Pastor.  Did provide the patient a detailed list of risks for the procedure and he agrees to proceed. Patient is  appropriate for endoscopic procedure(s) in the ambulatory ([place]) setting.  2.  Patient advised to hold his Eliquis for 2 days prior to time of procedure.  We will communicate with her prescribing physician to ensure this is acceptable for him. 3.  Patient to follow in clinic per recommendations from Dr. Henrene Pastor after time of procedure.  Ellouise Newer, PA-C [facility] 04/02/2022, 1:28 PM  Cc: Ginger Organ., MD

## 2022-04-15 ENCOUNTER — Telehealth: Payer: Self-pay

## 2022-04-15 NOTE — Telephone Encounter (Signed)
We have received cardiac clearance from Dr. Brigitte Pulse, patient has clearance to hold Eliquis 2 days prior to his scheduled colonoscopy on 05-18-2022. Hard copy letter has been sent to be scanned

## 2022-04-16 NOTE — Telephone Encounter (Signed)
Patient has been advised he may hold eliquis 2 days prior to upcoming procedure.

## 2022-05-18 ENCOUNTER — Encounter: Payer: Self-pay | Admitting: Internal Medicine

## 2022-05-18 ENCOUNTER — Ambulatory Visit (AMBULATORY_SURGERY_CENTER): Payer: Medicare Other | Admitting: Internal Medicine

## 2022-05-18 VITALS — BP 103/51 | HR 56 | Temp 97.1°F | Resp 17 | Ht 69.0 in | Wt 188.0 lb

## 2022-05-18 DIAGNOSIS — D120 Benign neoplasm of cecum: Secondary | ICD-10-CM

## 2022-05-18 DIAGNOSIS — D123 Benign neoplasm of transverse colon: Secondary | ICD-10-CM | POA: Diagnosis not present

## 2022-05-18 DIAGNOSIS — Z09 Encounter for follow-up examination after completed treatment for conditions other than malignant neoplasm: Secondary | ICD-10-CM

## 2022-05-18 DIAGNOSIS — Z86010 Personal history of colonic polyps: Secondary | ICD-10-CM

## 2022-05-18 MED ORDER — SODIUM CHLORIDE 0.9 % IV SOLN: 500.0000 mL | INTRAVENOUS | Status: DC

## 2022-05-18 NOTE — Progress Notes (Signed)
Called to room to assist during endoscopic procedure.  Patient ID and intended procedure confirmed with present staff. Received instructions for my participation in the procedure from the performing physician.  

## 2022-05-18 NOTE — Op Note (Signed)
[facility] Patient Name: Anthony Miles Procedure Date: 05/18/2022 11:30 AM MRN: [id number] Endoscopist: Docia Chuck. Henrene Pastor , MD, [id number] Age: 76 Referring MD:  Date of Birth: Mar 25, 1946 Gender: Male Account #: [id number] Procedure:                Colonoscopy with cold snare polypectomy x 3; biopsy                            polypectomy x 1 Indications:              High risk colon cancer surveillance: Personal                            history of multiple (3 or more) adenomas. Multiple                            prior examinations. Most recently 2015, 2018 Medicines:                Monitored Anesthesia Care Procedure:                Pre-Anesthesia Assessment:                           - Prior to the procedure, a History and Physical                            was performed, and patient medications and                            allergies were reviewed. The patient's tolerance of                            previous anesthesia was also reviewed. The risks                            and benefits of the procedure and the sedation                            options and risks were discussed with the patient.                            All questions were answered, and informed consent                            was obtained. Prior Anticoagulants: The patient has                            taken Eliquis (apixaban), last dose was 3 days                            prior to procedure. ASA Grade Assessment: III - A                            patient with severe systemic disease. After  reviewing the risks and benefits, the patient was                            deemed in satisfactory condition to undergo the                            procedure.                           After obtaining informed consent, the colonoscope                            was passed under direct vision. Throughout the                            procedure, the patient's blood pressure,  pulse, and                            oxygen saturations were monitored continuously. The                            Olympus CF-[id number]) Colonoscope was                            introduced through the anus and advanced to the the                            cecum, identified by appendiceal orifice and                            ileocecal valve. The ileocecal valve, appendiceal                            orifice, and rectum were photographed. The quality                            of the bowel preparation was good. The colonoscopy                            was performed without difficulty. The patient                            tolerated the procedure well. The bowel preparation                            used was SUPREP via split dose instruction. Scope In: 11:42:38 AM Scope Out: 11:56:34 AM Scope Withdrawal Time: 0 hours 12 minutes 26 seconds  Total Procedure Duration: 0 hours 13 minutes 56 seconds  Findings:                 A less than 1 mm polyp was found in the transverse                            colon. The polyp was removed with a jumbo cold  forceps. Resection and retrieval were complete.                           Three polyps were found in the transverse colon and                            cecum. The polyps were 1 to 3 mm in size. These                            polyps were removed with a cold snare. Resection                            and retrieval were complete.                           The terminal ileum appeared normal.                           The exam was otherwise without abnormality on                            direct and retroflexion views. Complications:            No immediate complications. Estimated blood loss:                            None. Estimated Blood Loss:     Estimated blood loss: none. Impression:               - One less than 1 mm polyp in the transverse colon,                            removed with a jumbo  cold forceps. Resected and                            retrieved.                           - Three 1 to 3 mm polyps in the transverse colon                            and in the cecum, removed with a cold snare.                            Resected and retrieved.                           - The examination was otherwise normal on direct                            and retroflexion views. Normal terminal ileum. Recommendation:           - Repeat colonoscopy is not recommended for  surveillance.                           - Resume Eliquis (apixaban) today at prior dose.                           - Patient has a contact number available for                            emergencies. The signs and symptoms of potential                            delayed complications were discussed with the                            patient. Return to normal activities tomorrow.                            Written discharge instructions were provided to the                            patient.                           - Resume previous diet.                           - Continue present medications.                           - Await pathology results. Docia Chuck. Henrene Pastor, MD 05/18/2022 12:04:29 PM This report has been signed electronically.

## 2022-05-18 NOTE — Progress Notes (Signed)
Pt initial BP in PACU was high but pt was starting to wake up and moving his arm.  The next BP was low but pt still doing the same.  After a minute or so, the pt alert enogh to relax arm and hold still.  That next BP had MAp in the 50s so I gave him 5 mg ephedrine and re took.  Pt alert and oriented and talking to his wife but next MAp still in 35s so I gave him another 5 mg ephedrine.  Next MAp in 71s and pt mental staus had not changed so felt comfortable leaving PACU

## 2022-05-18 NOTE — Patient Instructions (Addendum)
-   Resume Eliquis (apixaban) today at prior dose. - Repeat colonoscopy is not recommended for surveillance. - Patient has a contact number available for emergencies. The signs and symptoms of potential delayed complications were discussed with the patient. Return to normal activities tomorrow. Written discharge instructions were provided to the patient. - Resume previous diet. - Continue present medications. - Await pathology results. -Handout on polyps provided   YOU HAD AN ENDOSCOPIC PROCEDURE TODAY AT [facility]:   Refer to the procedure report that was given to you for any specific questions about what was found during the examination.  If the procedure report does not answer your questions, please call your gastroenterologist to clarify.  If you requested that your care partner not be given the details of your procedure findings, then the procedure report has been included in a sealed envelope for you to review at your convenience later.  YOU SHOULD EXPECT: Some feelings of bloating in the abdomen. Passage of more gas than usual.  Walking can help get rid of the air that was put into your GI tract during the procedure and reduce the bloating. If you had a lower endoscopy (such as a colonoscopy or flexible sigmoidoscopy) you may notice spotting of blood in your stool or on the toilet paper. If you underwent a bowel prep for your procedure, you may not have a normal bowel movement for a few days.  Please Note:  You might notice some irritation and congestion in your nose or some drainage.  This is from the oxygen used during your procedure.  There is no need for concern and it should clear up in a day or so.  SYMPTOMS TO REPORT IMMEDIATELY:  Following lower endoscopy (colonoscopy or flexible sigmoidoscopy):  Excessive amounts of blood in the stool  Significant tenderness or worsening of abdominal pains  Swelling of the abdomen that is new, acute  Fever of 100F or  higher  For urgent or emergent issues, a gastroenterologist can be reached at any hour by calling [phone]. Do not use MyChart messaging for urgent concerns.    DIET:  We do recommend a small meal at first, but then you may proceed to your regular diet.  Drink plenty of fluids but you should avoid alcoholic beverages for 24 hours.  ACTIVITY:  You should plan to take it easy for the rest of today and you should NOT DRIVE or use heavy machinery until tomorrow (because of the sedation medicines used during the test).    FOLLOW UP: Our staff will call the number listed on your records the next business day following your procedure.  We will call around 7:15- 8:00 am to check on you and address any questions or concerns that you may have regarding the information given to you following your procedure. If we do not reach you, we will leave a message.     If any biopsies were taken you will be contacted by phone or by letter within the next 1-3 weeks.  Please call us at [phone] if you have not heard about the biopsies in 3 weeks.    SIGNATURES/CONFIDENTIALITY: You and/or your care partner have signed paperwork which will be entered into your electronic medical record.  These signatures attest to the fact that that the information above on your After Visit Summary has been reviewed and is understood.  Full responsibility of the confidentiality of this discharge information lies with you and/or your care-partner.

## 2022-05-18 NOTE — Progress Notes (Signed)
Chief Complaint: History of adenomatous polyps, chronic anticoagulation   HPI:    Anthony Miles is a 76 year old male with a past medical history as listed below including pulmonary embolism and prostate cancer on Eliquis (09/22/2017 echo with normal LVEF 60-65%), known to Dr. Henrene Pastor, who was referred to me by Ginger Organ., MD for his history of adenomatous polyps and chronic anticoagulation.    09/28/2016 colonoscopy for surveillance given his personal history of adenomatous polyps on a colonoscopy 3 years prior.  At that time to 1-3 mm polyps in the descending and ascending colon otherwise normal.  Pathology showed tubular adenoma.  Repeat recommended in 5 years.    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  He has had to hold it before for hernia surgery.  Otherwise no GI complaints or concerns.    Denies fever, chills, weight loss, change in bowel habits or abdominal pain.       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[place])     Hyperlipidemia     Hypertension     Osteopenia     Prostate cancer ([place]) 2016   Pulmonary embolism ([facility])      saddle PE   Tubular adenoma of colon             Past Surgical History:  Procedure Laterality Date   LYMPHADENECTOMY Bilateral 04/10/2015    Procedure: LYMPHADENECTOMY;  Surgeon: Raynelle Bring, MD;  [facility];  Service: Urology;  Laterality: Bilateral;   ROBOT ASSISTED LAPAROSCOPIC RADICAL PROSTATECTOMY N/A 04/10/2015    Procedure: ROBOTIC ASSISTED LAPAROSCOPIC RADICAL PROSTATECTOMY LEVEL 2;  Surgeon: Raynelle Bring, MD;  [facility];  Service: Urology;  Laterality: N/A;   TONSILLECTOMY   [id number]   UMBILICAL HERNIA REPAIR N/A 05/16/2019    Procedure: UMBILICAL HERNIA REPAIR WITH MESH;   Surgeon: Donnie Mesa, MD;  Location: [facility];  Service: General;  Laterality: N/A;   VASECTOMY   1978            Current Outpatient Medications  Medication Sig Dispense Refill   apixaban (ELIQUIS) 2.5 MG TABS tablet Take 1 tablet (2.5 mg total) by mouth 2 (two) times daily. 60 tablet 5   calcium carbonate (OSCAL) 1500 (600 Ca) MG TABS tablet Take by mouth 2 (two) times daily with a meal.       hydrochlorothiazide (HYDRODIURIL) 25 MG tablet Take 25 mg by mouth daily.       lisinopril (ZESTRIL) 20 MG tablet Take 20 mg by mouth daily.       LUMIGAN 0.01 % SOLN 1 drop at bedtime.       Multiple Vitamin (MULTIVITAMIN) tablet Take 1 tablet by mouth daily.       oxyCODONE (OXY IR/ROXICODONE) 5 MG immediate release tablet Take 1 tablet (5 mg total) by mouth every 6 (six) hours as needed for severe pain. 15 tablet 0   rosuvastatin (CRESTOR) 20 MG tablet Take 1 tablet (20 mg total) by mouth daily. 90 tablet 3   sildenafil (REVATIO) 20 MG tablet as needed.       zolpidem (AMBIEN) 10 MG tablet  Current Facility-Administered Medications  Medication Dose Route Frequency Provider Last Rate Last Admin   0.9 %  sodium chloride infusion  500 mL Intravenous Continuous Irene Shipper, MD             Allergies as of 04/02/2022   (No Known Allergies)           Family History  Problem Relation Age of Onset   Breast cancer Mother     Colon cancer Neg Hx     Esophageal cancer Neg Hx     Rectal cancer Neg Hx     Stomach cancer Neg Hx        Social History         Socioeconomic History   Marital status: Married      Spouse name: Not on file   Number of children: Not on file   Years of education: Not on file   Highest education level: Not on file  Occupational History   Not on file  Tobacco Use   Smoking status: Never   Smokeless tobacco: Never  Vaping Use   Vaping Use: Never used  Substance and Sexual Activity   Alcohol use: Yes      Alcohol/week:  6.0 standard drinks of alcohol      Types: 6 Standard drinks or equivalent per week      Comment: occassionally   Drug use: No   Sexual activity: Not on file  Other Topics Concern   Not on file  Social History Narrative   Not on file    Social Determinants of Health    Financial Resource Strain: Not on file  Food Insecurity: Not on file  Transportation Needs: Not on file  Physical Activity: Not on file  Stress: Not on file  Social Connections: Not on file  Intimate Partner Violence: Not on file      Review of Systems:    Constitutional: No weight loss, fever or chills Skin: No rash  Cardiovascular: No chest pain Respiratory: No SOB  Gastrointestinal: See HPI and otherwise negative Genitourinary: No dysuria  Neurological: No headache, dizziness or syncope Musculoskeletal: No new muscle or joint pain Hematologic: No bleeding Psychiatric: No history of depression or anxiety    Physical Exam:  Vital signs: BP 118/62   Pulse 85   Ht '5\' 9"'$  (1.753 m)   Wt 188 lb 2 oz (85.3 kg)   BMI 27.78 kg/m     Constitutional:   Pleasant Caucasian male appears to be in NAD, Well developed, Well nourished, alert and cooperative Respiratory: Respirations even and unlabored. Lungs clear to auscultation bilaterally.   No wheezes, crackles, or rhonchi.  Cardiovascular: Normal S1, S2. No MRG. Regular rate and rhythm. No peripheral edema, cyanosis or pallor.  Gastrointestinal:  Soft, nondistended, nontender. No rebound or guarding. Normal bowel sounds. No appreciable masses or hepatomegaly. Rectal:  Not performed.  Psychiatric: Oriented to person, place and time. Demonstrates good judgement and reason without abnormal affect or behaviors.   Labs in [place].   Assessment: 1.  History of adenomatous polyps: Last colonoscopy in 2018 with repeat recommended in 5 years 2.  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  Scheduled surveillance colonoscopy in the [place] with Dr. Henrene Pastor.  Did  provide the patient a detailed list of risks for the procedure and he agrees to proceed. Patient is appropriate for endoscopic procedure(s) in the ambulatory ([place]) setting.  2.  Patient advised to hold his Eliquis for 2 days  prior to time of procedure.  We will communicate with her prescribing physician to ensure this is acceptable for him. 3.  Patient to follow in clinic per recommendations from Dr. Henrene Pastor after time of procedure.   Ellouise Newer, PA-C [facility] 04/02/2022, 1:28 PM

## 2022-05-19 ENCOUNTER — Telehealth: Payer: Self-pay | Admitting: *Deleted

## 2022-05-19 NOTE — Telephone Encounter (Signed)
  Follow up Call-     05/18/2022   10:24 AM  Call back number  Post procedure Call Back phone  # [phone]  Permission to leave phone message Yes     Post procedure follow up phone call. No answer at number given.  Left message on voicemail.

## 2022-05-20 ENCOUNTER — Encounter: Payer: Self-pay | Admitting: Internal Medicine

## 2022-06-23 ENCOUNTER — Telehealth (HOSPITAL_BASED_OUTPATIENT_CLINIC_OR_DEPARTMENT_OTHER): Payer: Self-pay | Admitting: Cardiology

## 2022-06-23 NOTE — Telephone Encounter (Signed)
Called to speak with patient and schedule his 12 month follow up with Dr. Harrell Gave.  He stated he is going to his PCP and if he needs to see Dr. Harrell Gave he will call

## 2022-07-15 ENCOUNTER — Other Ambulatory Visit (HOSPITAL_BASED_OUTPATIENT_CLINIC_OR_DEPARTMENT_OTHER): Payer: Self-pay | Admitting: Cardiology

## 2022-07-15 DIAGNOSIS — E782 Mixed hyperlipidemia: Secondary | ICD-10-CM

## 2022-07-15 NOTE — Telephone Encounter (Signed)
Rx(s) sent to pharmacy electronically.  

## 2024-05-02 DIAGNOSIS — M25551 Pain in right hip: Secondary | ICD-10-CM | POA: Insufficient documentation

## 2024-05-02 DIAGNOSIS — M5416 Radiculopathy, lumbar region: Secondary | ICD-10-CM | POA: Insufficient documentation

## 2024-05-02 DIAGNOSIS — M7061 Trochanteric bursitis, right hip: Secondary | ICD-10-CM | POA: Insufficient documentation

## 2024-05-02 DIAGNOSIS — M25561 Pain in right knee: Secondary | ICD-10-CM | POA: Insufficient documentation

## 2024-07-12 ENCOUNTER — Other Ambulatory Visit (HOSPITAL_COMMUNITY): Payer: Self-pay | Admitting: Urology

## 2024-07-12 DIAGNOSIS — C61 Malignant neoplasm of prostate: Secondary | ICD-10-CM

## 2024-07-16 ENCOUNTER — Encounter (HOSPITAL_COMMUNITY)
Admission: RE | Admit: 2024-07-16 | Discharge: 2024-07-16 | Disposition: A | Discharge status: Home / Self Care | Source: Ambulatory Visit | Attending: Urology | Admitting: Urology

## 2024-07-16 DIAGNOSIS — C61 Malignant neoplasm of prostate: Secondary | ICD-10-CM | POA: Insufficient documentation

## 2024-07-16 MED ORDER — FLOTUFOLASTAT F 18 GALLIUM 296-5846 MBQ/ML IV SOLN
7.9900 | Freq: Once | INTRAVENOUS | Status: AC
  Administered 2024-07-16: 7.99 via INTRAVENOUS

## 2024-07-24 ENCOUNTER — Telehealth: Payer: Self-pay | Admitting: Radiation Oncology

## 2024-07-24 NOTE — Telephone Encounter (Signed)
 1/20 Patient called ready to be schedule for consult, before he goes out of town, unfortunately we have quick a few consults ahead of patient.  Patient stated will be out of town in the month of February and will return by March 2, If not before then would be available after March 2, to be schedule.

## 2024-07-24 NOTE — Telephone Encounter (Signed)
 Left message for patient to call back to schedule consult per 11/4 referral.

## 2024-07-26 NOTE — Progress Notes (Incomplete)
 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 Gretel Ferrara [facility]) elevated PSA.  Sites of Visceral and Bony Metastatic Disease: Left inferior pubic ramus   Location(s) of Symptomatic Metastases: Left inferior pubic ramus   07/16/2024 Dr. Gretel Ferrara NM PET (PSMA) Skull to Mid Thigh CLINICAL DATA:   Prostatectomy 9 years ago with PSA of 0.3 on 06/26/2024.  IMPRESSION: 1. 9 mm left inferior pubic ramus sclerotic tracer avid lesion, most consistent with isolated osseous metastasis. 2. Status post prostatectomy. 3. Tracer affinity about the glans of the penis is likely due to urinary contamination. This could be correlated with physical exam. 4. Tiny bilateral pulmonary nodules are below PET resolution and likely incidental/benign. Consider chest CT follow-up at 6 months. 5. Incidental findings, including: Coronary artery atherosclerosis. Aortic Atherosclerosis (ICD10-I70.0).    Past/Anticipated chemotherapy by medical oncology, if any: NA  Pain on a scale of 0-10 is:     If Spine Met(s), symptoms, if any, include: Bowel/Bladder retention or incontinence (please describe): *** Numbness or weakness in extremities (please describe): *** Current Decadron  regimen, if applicable: ***  Ambulatory status? Walker? Wheelchair?: {VQI Ambulatory Status:20974}  SAFETY ISSUES: Prior radiation? {:18581} Pacemaker/ICD? {:18581} Possible current pregnancy? Male Is the patient on methotrexate? No  Current Complaints / other details:    30 minutes spent total, including time for meaningful use questions, reviewing medication, as well as spent in face-to-face time in nurse evaluation with the patient.

## 2024-07-31 ENCOUNTER — Ambulatory Visit: Admitting: Radiation Oncology

## 2024-07-31 ENCOUNTER — Ambulatory Visit

## 2024-08-01 ENCOUNTER — Encounter: Payer: Self-pay | Admitting: Radiation Oncology

## 2024-08-01 ENCOUNTER — Telehealth: Payer: Self-pay | Admitting: Radiation Oncology

## 2024-08-01 NOTE — Telephone Encounter (Signed)
 Left message for patient to call back to reschedule past appointment due to weather.

## 2024-08-01 NOTE — Progress Notes (Signed)
 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 Gretel Ferrara [facility]) elevated PSA.  Sites of Visceral and Bony Metastatic Disease: Left inferior pubic ramus   Location(s) of Symptomatic Metastases: Left inferior pubic ramus   07/16/2024 Dr. Gretel Ferrara NM PET (PSMA) Skull to Mid Thigh CLINICAL DATA:   Prostatectomy 9 years ago with PSA of 0.3 on 06/26/2024.  IMPRESSION: 1. 9 mm left inferior pubic ramus sclerotic tracer avid lesion, most consistent with isolated osseous metastasis. 2. Status post prostatectomy. 3. Tracer affinity about the glans of the penis is likely due to urinary contamination. This could be correlated with physical exam. 4. Tiny bilateral pulmonary nodules are below PET resolution and likely incidental/benign. Consider chest CT follow-up at 6 months. 5. Incidental findings, including: Coronary artery atherosclerosis. Aortic Atherosclerosis (ICD10-I70.0).    Past/Anticipated chemotherapy by medical oncology, if any: NA  Pain on a scale of 0-10 is:  0/10   If Spine Met(s), symptoms, if any, include: Bowel/Bladder retention or incontinence (please describe): No Numbness or weakness in extremities (please describe): No Current Decadron  regimen, if applicable:   Ambulatory status? Walker? Wheelchair?: Independent  SAFETY ISSUES: Prior radiation? No Pacemaker/ICD? No Possible current pregnancy? Male Is the patient on methotrexate? No  Current Complaints / other details: None  30 minutes spent total, including time for meaningful use questions, reviewing medication, as well as spent in face-to-face time in nurse evaluation with the patient.

## 2024-08-02 ENCOUNTER — Encounter: Payer: Self-pay | Admitting: Radiation Oncology

## 2024-08-02 ENCOUNTER — Ambulatory Visit
Admission: RE | Admit: 2024-08-02 | Discharge: 2024-08-02 | Disposition: A | Discharge status: Home / Self Care | Source: Ambulatory Visit | Attending: Radiation Oncology | Admitting: Radiation Oncology

## 2024-08-02 VITALS — BP 139/73 | HR 92 | Temp 97.8°F | Resp 18 | Ht 69.0 in | Wt 183.6 lb

## 2024-08-02 DIAGNOSIS — C61 Malignant neoplasm of prostate: Secondary | ICD-10-CM

## 2024-08-02 HISTORY — DX: Elevated prostate specific antigen (PSA): R97.20

## 2024-08-02 NOTE — Progress Notes (Signed)
 " Radiation Oncology         [phone] ________________________________  Initial Outpatient Consultation  Name: Anthony Miles MRN: [id number]  Date: 08/02/2024  DOB: 1946-02-14  RR:Dyjt, Elsie JONETTA Raddle., MD  Renda Glance, MD   REFERRING PHYSICIAN: Renda Glance, MD  DIAGNOSIS: 79 y.o. gentleman with a rising PSA of 0.314 and a solitary osseous metastasis in the left inferior pubic ramus on restaging PSMA PET, s/p RALP 04/10/2015 for Stage pT3a N0, Gleason 4+3 prostate cancer with pretreatment PSA of 11.23.    ICD-10-CM   1. Malignant neoplasm of prostate (HCC)  C61     2. Malignant neoplasm of prostate metastatic to bone [facility])  C61    C79.51       HISTORY OF PRESENT ILLNESS: OLUWAFEMI Miles is a 79 y.o. male with a diagnosis of biochemically recurrent prostate cancer. He was initially referred to Dr. Renda Glance, MD for an elevated PSA of 5.8 in August 2010 and was diagnosed with Gleason 3+3 prostate cancer on biopsy 02/10/2009. He elected to pursue active surveillance and in 2011, his PSA was 8.25. A repeat TRUSP biopsy noted high grade PIN in the left base lateral and left base. In 2013, with a PSA of 5.37, the biopsy showed that the left mid lateral and left mid areas had become GG 3+3=6 with the previous areas remaining high grade PIN. Repeat surveillance biopsy in 2015 showed the two GG 3+3 areas were stable with gradual increasing involvement but in 2016, he underwent a prostate MRI and MR fusion biopsy which revealed Gleason 3+4=7 in all 6 left sided cores. He opted to proceed with RALP on 04/10/2015 under the care of Dr. Renda Glance, MD and pretreatment PSA at that time was 11.23. Final surgical pathology confirmed Gleason 4+3 prostatic adenocarcinoma with extraprostatic extension in the left posterolateral mid gland, but negative margins and no involvement of seminal vesicles or vasa deferentia and all sampled lymph nodes were negative.  Initially, the postoperative PSA was  undetectable and remained undetectable until it became low detectable at 0.074 in October 2024.  The PSA has continued to gradually rise since that time at 0.08 in January 2025, 0.11 in May 2025 and most recently reached the threshold for biochemical recurrence at 0.314 on 06/26/2024.  A PSMA PET scan was performed on 07/16/2024 for disease restaging and showed an isolated osseous metastasis in the left inferior pubic ramus as well as several tiny bilateral pulmonary nodules that will warrant further evaluation with a repeat CT chest in 6 months.    The patient reviewed the PSMA PET imaging and PSA results with his urologist and he has kindly been referred today for discussion of potential salvage radiation treatment options.   PREVIOUS RADIATION THERAPY: No  PAST MEDICAL HISTORY:  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HCC)    Elevated PSA    Hyperlipidemia    Hypertension    Osteopenia    Prostate cancer (HCC) 2016   Pulmonary embolism (HCC)    saddle PE   Tubular adenoma of colon       PAST SURGICAL HISTORY: Past Surgical History:  Procedure Laterality Date   LYMPHADENECTOMY Bilateral 04/10/2015   Procedure: LYMPHADENECTOMY;  Surgeon: Glance Renda, MD;  Location: [facility];  Service: Urology;  Laterality: Bilateral;   PROSTATE BIOPSY     ROBOT ASSISTED LAPAROSCOPIC RADICAL PROSTATECTOMY N/A 04/10/2015   Procedure: ROBOTIC ASSISTED LAPAROSCOPIC RADICAL PROSTATECTOMY LEVEL 2;  Surgeon: Glance Renda,  MD;  Location: [facility];  Service: Urology;  Laterality: N/A;   TONSILLECTOMY  1955   UMBILICAL HERNIA REPAIR N/A 05/16/2019   Procedure: UMBILICAL HERNIA REPAIR WITH MESH;  Surgeon: Belinda Cough, MD;  Location: [facility];  Service: General;  Laterality: N/A;   VASECTOMY  1978    FAMILY HISTORY:  Family History  Problem Relation Age of Onset   Breast cancer Mother    Colon cancer Neg Hx    Esophageal cancer Neg Hx    Rectal  cancer Neg Hx    Stomach cancer Neg Hx     SOCIAL HISTORY:  Social History   Socioeconomic History   Marital status: Married    Spouse name: Not on file   Number of children: Not on file   Years of education: Not on file   Highest education level: Not on file  Occupational History   Not on file  Tobacco Use   Smoking status: Never   Smokeless tobacco: Never  Vaping Use   Vaping status: Never Used  Substance and Sexual Activity   Alcohol use: Yes    Alcohol/week: 6.0 standard drinks of alcohol    Types: 6 Standard drinks or equivalent per week    Comment: occassionally   Drug use: No   Sexual activity: Not on file  Other Topics Concern   Not on file  Social History Narrative   Not on file   Social Drivers of Health   Tobacco Use: Low Risk (08/01/2024)   Patient History    Smoking Tobacco Use: Never    Smokeless Tobacco Use: Never    Passive Exposure: Not on file  Financial Resource Strain: Not on file  Food Insecurity: Not on file  Transportation Needs: Not on file  Physical Activity: Not on file  Stress: Not on file  Social Connections: Not on file  Intimate Partner Violence: Not on file  Depression (EYV7-0): Not on file  Alcohol Screen: Not on file  Housing: Not on file  Utilities: Not on file  Health Literacy: Not on file    ALLERGIES: Patient has no known allergies.  MEDICATIONS:  Current Outpatient Medications  Medication Sig Dispense Refill   ELIQUIS  5 MG TABS tablet Take 5 mg by mouth 2 (two) times daily.     calcium  carbonate (OSCAL) 1500 (600 Ca) MG TABS tablet Take by mouth 2 (two) times daily with a meal.     hydrochlorothiazide  (HYDRODIURIL ) 25 MG tablet Take 25 mg by mouth daily.     lisinopril (ZESTRIL) 20 MG tablet Take 20 mg by mouth daily.     LUMIGAN 0.01 % SOLN 1 drop at bedtime.     Multiple Vitamin (MULTIVITAMIN) tablet Take 1 tablet by mouth daily.     Na Sulfate-K Sulfate-Mg Sulf 17.5-3.13-1.6 GM/177ML SOLN Use as directed; may use  generic; goodrx card if insurance will not cover generic 354 mL 0   rosuvastatin  (CRESTOR ) 20 MG tablet Take 1 tablet (20 mg total) by mouth daily. Need appointment 30 tablet 0   sildenafil (REVATIO) 20 MG tablet as needed.     zolpidem (AMBIEN) 10 MG tablet      Current Facility-Administered Medications  Medication Dose Route Frequency Provider Last Rate Last Admin   0.9 %  sodium chloride  infusion  500 mL Intravenous Continuous Abran Norleen SAILOR, MD        REVIEW OF SYSTEMS:  On review of systems, the patient reports that he is doing well overall. He denies  any chest pain, shortness of breath, cough, fevers, chills, night sweats, unintended weight changes. He denies any bowel disturbances, and denies abdominal pain, nausea or vomiting. He denies any new musculoskeletal or joint aches or pains. A complete review of systems is obtained and is otherwise negative.    PHYSICAL EXAM:  Wt Readings from Last 3 Encounters:  05/18/22 188 lb (85.3 kg)  04/02/22 188 lb 2 oz (85.3 kg)  06/09/21 186 lb (84.4 kg)   Temp Readings from Last 3 Encounters:  05/18/22 (!) 97.1 F (36.2 C) (Temporal)  05/16/19 97.9 F (36.6 C) (Oral)  09/28/16 98 F (36.7 C)   BP Readings from Last 3 Encounters:  05/18/22 (!) 103/51  04/02/22 118/62  06/09/21 130/70   Pulse Readings from Last 3 Encounters:  05/18/22 (!) 56  04/02/22 85  06/09/21 64    /10  In general this is a well appearing Caucasian man in no acute distress. He's alert and oriented x4 and appropriate throughout the examination. Cardiopulmonary assessment is negative for acute distress, and he exhibits normal effort.     KPS = 100  100 - Normal; no complaints; no evidence of disease. 90   - Able to carry on normal activity; minor signs or symptoms of disease. 80   - Normal activity with effort; some signs or symptoms of disease. 59   - Cares for self; unable to carry on normal activity or to do active work. 60   - Requires occasional  assistance, but is able to care for most of his personal needs. 50   - Requires considerable assistance and frequent medical care. 40   - Disabled; requires special care and assistance. 30   - Severely disabled; hospital admission is indicated although death not imminent. 20   - Very sick; hospital admission necessary; active supportive treatment necessary. 10   - Moribund; fatal processes progressing rapidly. 0     - Dead  Karnofsky DA, Abelmann WH, Craver LS and Burchenal [facility] [phone]) The use of the nitrogen mustards in the palliative treatment of carcinoma: with particular reference to bronchogenic carcinoma Cancer 1 634-56  LABORATORY DATA:  Lab Results  Component Value Date   WBC 7.1 04/07/2015   HGB 12.2 (L) 04/11/2015   HCT 36.2 (L) 04/11/2015   MCV 95.0 04/07/2015   PLT 247 04/07/2015   Lab Results  Component Value Date   NA 141 05/11/2019   K 4.9 05/11/2019   CL 106 05/11/2019   CO2 26 05/11/2019   No results found for: ALT, AST, GGT, ALKPHOS, BILITOT   RADIOGRAPHY: NM PET (PSMA) SKULL TO MID THIGH Result Date: 07/16/2024 CLINICAL DATA:  Prostatectomy 9 years ago with PSA of 0.3 on 06/26/2024. EXAM: NUCLEAR MEDICINE PET SKULL BASE TO THIGH TECHNIQUE: 7.9 mCi Flotufolastat (Posluma ) was injected intravenously. Full-ring PET imaging was performed from the skull base to thigh after the radiotracer. CT data was obtained and used for attenuation correction and anatomic localization. COMPARISON:  12/25/2014 prostate MRI. FINDINGS: NECK No radiotracer activity in neck lymph nodes. Incidental CT finding: Dense bilateral carotid atherosclerosis. CHEST No radiotracer accumulation within mediastinal or hilar lymph nodes. Incidental CT finding: Moderate cardiomegaly. Aortic and coronary artery calcification. Mild right hemidiaphragm elevation. lingular and right lower lobe scarring. 4 mm left lower lobe pulmonary nodule on image 48/7. 3 mm right upper lobe pulmonary nodule on image  17/7. ABDOMEN/PELVIS Prostate: Prostatectomy. No tracer avid focus within the operative bed to suggest local recurrence. Lymph nodes: No abnormal radiotracer  accumulation within pelvic or abdominal nodes. Liver: No evidence of liver metastasis. Probable urinary contamination about the glands penis on image 189. Incidental CT finding: Normal adrenal glands. Right renal fluid density lesions of up to 1.3 cm are likely cysts. No follow-up indicated. Abdominal aortic atherosclerosis. Small fat containing left inguinal hernia. SKELETON Left inferior pubic ramus tracer avid sclerotic lesion measures 9 mm and SUV 5.2 on image 192/4. IMPRESSION: 1. 9 mm left inferior pubic ramus sclerotic tracer avid lesion, most consistent with isolated osseous metastasis. 2. Status post prostatectomy. 3. Tracer affinity about the glans of the penis is likely due to urinary contamination. This could be correlated with physical exam. 4. Tiny bilateral pulmonary nodules are below PET resolution and likely incidental/benign. Consider chest CT follow-up at 6 months. 5. Incidental findings, including: Coronary artery atherosclerosis. Aortic Atherosclerosis (ICD10-I70.0). Electronically Signed   By: Rockey Kilts M.D.   On: 07/16/2024 14:58      IMPRESSION/PLAN: 1.  79 y.o. gentleman with a rising PSA of 0.314 and a solitary osseous metastasis in the left inferior pubic ramus on restaging PSMA PET, s/p RALP 04/10/2015 for Stage pT3a N0, Gleason 4+3 prostate cancer with pretreatment PSA of 11.23.  Today we talked to the patient and his wife, Mercy, about the findings and workup thus far.   We discussed the natural history of prostate cancer.  We reviewed the the implications of positive margins, extracapsular extension, and seminal vesicle involvement on the risk of prostate cancer recurrence. In his case, extracapsular extension was present.  More recently, he has had a rising, postoperative PSA which reached 0.314 in December 2025,  prompting further evaluation/disease restaging with PSMA PET scan which confirmed a solitary osseous metastasis in the left inferior pubic ramus with no evidence of local recurrence in the prostatic fossa.  We discussed the role of stereotactic radiotherapy (SBRT) in the management of oligometastatic disease and focused on the details and logistics of delivery. The recommendation is for metastasis directed therapy (MDT) with a 5 fraction course of SBRT to the isolated osseous metastasis in the left pubic ramus. We reviewed the anticipated acute and late sequelae associated with radiation in this setting. The patient and his wife were encouraged to ask questions that were answered to their stated satisfaction.  At the conclusion of our conversation, the patient is interested in moving forward with the recommended  5 fraction course of SBRT to the isolated osseous metastasis in the left pubic ramus. We did discuss the potential to enroll in the [id number] clinical trial but he declined. They are leaving for [place]  in the morning and will be returning on 09/02/27 so he prefers to postpone the start of treatment until early March which we feel is quite reasonable. He has freely signed written consent to proceed today in the office and a copy of this document will be placed in his medical record. We will share our discussion with Dr. Renda and arrange for CT SIM/treatment planning when they return from [place] , first week of March, in anticipation of beginning his treatments shortly thereafter. We enjoyed meeting him and his wife today and look forward to continuing to participate in his care.  We personally spent 60 minutes in this encounter including chart review, reviewing radiological studies, meeting face-to-face with the patient, entering orders and completing documentation.    Sabra MICAEL Rusk, PA-C    Donnice Barge, MD  [facility]  Radiation Oncology Direct Dial: [phone]  Fax:  [phone] [email or web address]  Skype  LinkedIn  This document serves as a record of services personally performed by Donnice Barge, MD and Sabra Rusk, PA-C. It was created on their behalf by Damien Blanks, a trained medical scribe. The creation of this record is based on the scribe's personal observations and the provider's statements to them. This document has been checked and approved by the attending provider.   "

## 2024-09-04 ENCOUNTER — Ambulatory Visit: Admitting: Radiation Oncology

## 2024-09-12 ENCOUNTER — Ambulatory Visit: Admitting: Radiation Oncology

## 2024-09-13 ENCOUNTER — Ambulatory Visit: Admitting: Radiation Oncology

## 2024-09-14 ENCOUNTER — Ambulatory Visit: Admitting: Radiation Oncology

## 2024-09-17 ENCOUNTER — Ambulatory Visit: Admitting: Radiation Oncology

## 2024-09-18 ENCOUNTER — Ambulatory Visit: Admitting: Radiation Oncology

## 2024-09-19 ENCOUNTER — Ambulatory Visit: Admitting: Radiation Oncology

## 2024-09-21 ENCOUNTER — Ambulatory Visit: Admitting: Radiation Oncology
